# Patient Record
Sex: Male | Born: 1980
Health system: Southern US, Community
[De-identification: ages and names within clinical notes are randomized; demographics above are authoritative.]

## PROBLEM LIST (undated history)

## (undated) DIAGNOSIS — R51 Headache: Secondary | ICD-10-CM

## (undated) DIAGNOSIS — B019 Varicella without complication: Secondary | ICD-10-CM

## (undated) DIAGNOSIS — F419 Anxiety disorder, unspecified: Secondary | ICD-10-CM

## (undated) DIAGNOSIS — E119 Type 2 diabetes mellitus without complications: Secondary | ICD-10-CM

## (undated) DIAGNOSIS — I10 Essential (primary) hypertension: Secondary | ICD-10-CM

## (undated) DIAGNOSIS — F32A Depression, unspecified: Secondary | ICD-10-CM

## (undated) DIAGNOSIS — G43909 Migraine, unspecified, not intractable, without status migrainosus: Secondary | ICD-10-CM

## (undated) DIAGNOSIS — R519 Headache, unspecified: Secondary | ICD-10-CM

## (undated) DIAGNOSIS — F329 Major depressive disorder, single episode, unspecified: Secondary | ICD-10-CM

## (undated) HISTORY — DX: Headache, unspecified: R51.9

## (undated) HISTORY — DX: Type 2 diabetes mellitus without complications: E11.9

## (undated) HISTORY — DX: Anxiety disorder, unspecified: F41.9

## (undated) HISTORY — DX: Migraine, unspecified, not intractable, without status migrainosus: G43.909

## (undated) HISTORY — DX: Depression, unspecified: F32.A

## (undated) HISTORY — DX: Headache: R51

## (undated) HISTORY — DX: Essential (primary) hypertension: I10

## (undated) HISTORY — DX: Varicella without complication: B01.9

## (undated) HISTORY — DX: Major depressive disorder, single episode, unspecified: F32.9

---

## 2002-05-13 ENCOUNTER — Inpatient Hospital Stay (HOSPITAL_COMMUNITY): Admission: AC | Admit: 2002-05-13 | Discharge: 2002-05-14 | Payer: Self-pay

## 2012-03-08 ENCOUNTER — Emergency Department: Payer: Self-pay | Admitting: Emergency Medicine

## 2012-03-08 LAB — URINALYSIS, COMPLETE
Bacteria: NONE SEEN
Bilirubin,UR: NEGATIVE
Blood: NEGATIVE
Glucose,UR: >=500 mg/dL (ref 0–75)
Protein: NEGATIVE
RBC,UR: NONE SEEN /HPF (ref 0–5)
Specific Gravity: 1.031 (ref 1.003–1.030)
Squamous Epithelial: NONE SEEN
WBC UR: <1 /HPF (ref 0–5)

## 2012-03-08 LAB — COMPREHENSIVE METABOLIC PANEL
Albumin: 4.3 g/dL (ref 3.4–5.0)
Alkaline Phosphatase: 142 U/L — ABNORMAL HIGH (ref 50–136)
BUN: 12 mg/dL (ref 7–18)
Bilirubin,Total: 0.4 mg/dL (ref 0.2–1.0)
Chloride: 98 mmol/L (ref 98–107)
Co2: 21 mmol/L (ref 21–32)
Creatinine: 0.92 mg/dL (ref 0.60–1.30)
EGFR (African American): >60
Glucose: 514 mg/dL (ref 65–99)
SGOT(AST): 21 U/L (ref 15–37)
SGPT (ALT): 20 U/L (ref 12–78)
Total Protein: 7.1 g/dL (ref 6.4–8.2)

## 2012-03-08 LAB — CBC
MCV: 88 fL (ref 80–100)
Platelet: 244 10*3/uL (ref 150–440)
RDW: 12.6 % (ref 11.5–14.5)
WBC: 6.6 10*3/uL (ref 3.8–10.6)

## 2016-04-21 ENCOUNTER — Ambulatory Visit: Payer: Self-pay | Admitting: *Deleted

## 2016-05-30 ENCOUNTER — Ambulatory Visit: Payer: Self-pay | Admitting: Primary Care

## 2016-06-06 ENCOUNTER — Ambulatory Visit: Payer: Self-pay | Admitting: Primary Care

## 2016-06-14 ENCOUNTER — Ambulatory Visit (INDEPENDENT_AMBULATORY_CARE_PROVIDER_SITE_OTHER): Payer: No Typology Code available for payment source | Admitting: Primary Care

## 2016-06-14 ENCOUNTER — Other Ambulatory Visit: Payer: Self-pay | Admitting: Primary Care

## 2016-06-14 ENCOUNTER — Encounter: Payer: Self-pay | Admitting: Primary Care

## 2016-06-14 VITALS — BP 142/92 | HR 102 | Temp 98.0°F | Ht 68.5 in | Wt 146.4 lb

## 2016-06-14 DIAGNOSIS — IMO0002 Reserved for concepts with insufficient information to code with codable children: Secondary | ICD-10-CM | POA: Insufficient documentation

## 2016-06-14 DIAGNOSIS — F418 Other specified anxiety disorders: Secondary | ICD-10-CM

## 2016-06-14 DIAGNOSIS — F419 Anxiety disorder, unspecified: Principal | ICD-10-CM

## 2016-06-14 DIAGNOSIS — E109 Type 1 diabetes mellitus without complications: Secondary | ICD-10-CM | POA: Diagnosis not present

## 2016-06-14 DIAGNOSIS — E1065 Type 1 diabetes mellitus with hyperglycemia: Secondary | ICD-10-CM | POA: Insufficient documentation

## 2016-06-14 DIAGNOSIS — E785 Hyperlipidemia, unspecified: Secondary | ICD-10-CM

## 2016-06-14 DIAGNOSIS — I10 Essential (primary) hypertension: Secondary | ICD-10-CM

## 2016-06-14 DIAGNOSIS — F329 Major depressive disorder, single episode, unspecified: Secondary | ICD-10-CM

## 2016-06-14 DIAGNOSIS — F32A Depression, unspecified: Secondary | ICD-10-CM

## 2016-06-14 LAB — COMPREHENSIVE METABOLIC PANEL
ALT: 15 U/L (ref 0–53)
AST: 14 U/L (ref 0–37)
Albumin: 4.5 g/dL (ref 3.5–5.2)
Alkaline Phosphatase: 61 U/L (ref 39–117)
BUN: 6 mg/dL (ref 6–23)
CO2: 34 mEq/L — ABNORMAL HIGH (ref 19–32)
Calcium: 10 mg/dL (ref 8.4–10.5)
Chloride: 96 mEq/L (ref 96–112)
Creatinine, Ser: 0.95 mg/dL (ref 0.40–1.50)
GFR: 96.19 mL/min (ref >60.00)
Glucose, Bld: 211 mg/dL — ABNORMAL HIGH (ref 70–99)
Potassium: 4.2 mEq/L (ref 3.5–5.1)
Sodium: 135 mEq/L (ref 135–145)
Total Bilirubin: 0.5 mg/dL (ref 0.2–1.2)
Total Protein: 7 g/dL (ref 6.0–8.3)

## 2016-06-14 LAB — LIPID PANEL
CHOLESTEROL: 266 mg/dL — AB (ref 0–200)
HDL: 56.3 mg/dL (ref >39.00)
LDL Cholesterol: 192 mg/dL — ABNORMAL HIGH (ref 0–99)
NonHDL: 209.68
Total CHOL/HDL Ratio: 5
Triglycerides: 89 mg/dL (ref 0.0–149.0)
VLDL: 17.8 mg/dL (ref 0.0–40.0)

## 2016-06-14 LAB — HEMOGLOBIN A1C: HEMOGLOBIN A1C: 9.5 % — AB (ref 4.6–6.5)

## 2016-06-14 LAB — MICROALBUMIN / CREATININE URINE RATIO
Creatinine,U: 36.5 mg/dL
MICROALB/CREAT RATIO: 1.9 mg/g (ref 0.0–30.0)

## 2016-06-14 MED ORDER — VENLAFAXINE HCL ER 75 MG PO CP24: 75.0000 mg | ORAL_CAPSULE | Freq: Every day | ORAL | 3 refills | Status: DC

## 2016-06-14 MED ORDER — INSULIN ASPART PROT & ASPART (70-30 MIX) 100 UNIT/ML PEN: 10.0000 [IU] | PEN_INJECTOR | Freq: Two times a day (BID) | SUBCUTANEOUS | 5 refills | Status: DC

## 2016-06-14 MED ORDER — LISINOPRIL 10 MG PO TABS: 10.0000 mg | ORAL_TABLET | Freq: Every day | ORAL | 0 refills | Status: DC

## 2016-06-14 NOTE — Patient Instructions (Signed)
Complete lab work prior to leaving today. I will notify you of your results once received. I will refill your Novolin 70/30 insulin once we receive your A1C result.  I sent refills of Effexor XR 75 mg to your pharmacy.   Start lisinopril 10 mg tablets for blood pressure and kidney protection.   Check your blood pressure daily, around the same time of day, for the next 3 weeks.  Ensure that you have rested for 30 minutes prior to checking your blood pressure. Record your readings and bring them to your next visit.  It was a pleasure to meet you today! Please don't hesitate to call me with any questions. Welcome to [facility]!

## 2016-06-14 NOTE — Assessment & Plan Note (Signed)
Prior history of elevated readings, not on treatment. Check lipids today.

## 2016-06-14 NOTE — Progress Notes (Signed)
Pre visit review using our clinic review tool, if applicable. No additional management support is needed unless otherwise documented below in the visit note. 

## 2016-06-14 NOTE — Assessment & Plan Note (Signed)
Prior history, above goal today. Will initiate Lisinopril 10 mg today for renal protection and treatment. Follow up in 3 weeks for BP recheck and BMP.

## 2016-06-14 NOTE — Assessment & Plan Note (Signed)
Well managed on Effexor XR 75 mg, continue same. Denies SI/HI. Refills provided.

## 2016-06-14 NOTE — Progress Notes (Signed)
Subjective:    Patient ID: James Stanley, male    DOB: 23-Sep-1980, 36 y.o.   MRN: [id number]  HPI  James Stanley is a 36 year old male who presents today to establish care and discuss the problems mentioned below. Will obtain old records.  1) Type 1 Diabetes: Diagnosed several years ago. Currently managed on Novolog 70/30 4 units BID and Lantus 12 units at bedtime. His last A1C was over 1 year ago and doesn't remember his last reading. He denies dizziness, chest pain. He does have intermittent numbness to his hands, not bothersome. He checks his sugars 10 times daily. Fasting sugars run in the 180's, evening sugars run in the low 200's. He does think he had an elevated lipid panel 1 year ago, never managed on treatment.  2) Essential Hypertension: BP in the office today of 142/92. He is not on antihypertensive treatment at the present. History of hypertension in the past and was managed on lisinopril/HCTZ, has not had this medication in about 1 year as he never had it refilled. He does not check his BP regularly.   3) Anxiety and Depression: Currently managed on Effexor XR 75 mg. Previously managed on Celexa 40 mg and Clonazepam. He feels well managed on Effexor XR 75 mg alone. He denies SI/HI. He is requesting refills.  Review of Systems  Constitutional: Negative for fatigue.  Eyes: Negative for visual disturbance.  Respiratory: Negative for shortness of breath.   Cardiovascular: Negative for chest pain.  Neurological: Negative for headaches.       Intermittent numbness to hands  Psychiatric/Behavioral: Negative for suicidal ideas. The patient is not nervous/anxious.        Past Medical History:  Diagnosis Date  . Anxiety   . Chickenpox   . Depression   . Diabetes mellitus without complication ([place])   . Frequent headaches   . Hypertension   . Migraines      Social History   Social History  . Marital status: Single    Spouse name: N/A  . Number of children: N/A  . Years of  education: N/A   Occupational History  . Not on file.   Social History Main Topics  . Smoking status: Never Smoker  . Smokeless tobacco: Never Used  . Alcohol use No  . Drug use: Unknown  . Sexual activity: Not on file   Other Topics Concern  . Not on file   Social History Narrative   Single.   Works at [employer].   Moved from [place].   Enjoys reading.    No past surgical history on file.  Family History  Problem Relation Age of Onset  . Hyperlipidemia Mother   . Mental illness Mother   . Hypertension Father   . Diabetes Father   . Mental illness Father   . Hyperlipidemia Maternal Grandmother   . Heart disease Paternal Grandfather   . Hypertension Paternal Grandfather   . Testicular cancer Paternal Grandfather     No Known Allergies  No current outpatient prescriptions on file prior to visit.   No current facility-administered medications on file prior to visit.     BP (!) 142/92   Pulse (!) 102   Temp 98 F (36.7 C) (Oral)   Ht 5' 8.5" (1.74 m)   Wt 146 lb 6.4 oz (66.4 kg)   SpO2 98%   BMI 21.94 kg/m    Objective:   Physical Exam  Constitutional: He is oriented to person, place, and time.  He appears well-nourished.  Neck: Neck supple.  Cardiovascular: Normal rate and regular rhythm.   Pulmonary/Chest: Effort normal and breath sounds normal. He has no wheezes. He has no rales.  Neurological: He is alert and oriented to person, place, and time.  Skin: Skin is warm and dry.  Psychiatric: He has a normal mood and affect.          Assessment & Plan:

## 2016-06-14 NOTE — Assessment & Plan Note (Signed)
Diagnosed several years ago. Sugars seem high. Check A1C today. Will adjust insulin as needed. He will think about pneumonia vaccination. Foot exam next visit. Lipids pending.

## 2016-06-15 ENCOUNTER — Other Ambulatory Visit: Payer: Self-pay | Admitting: Primary Care

## 2016-06-15 DIAGNOSIS — E785 Hyperlipidemia, unspecified: Secondary | ICD-10-CM

## 2016-06-15 MED ORDER — ATORVASTATIN CALCIUM 40 MG PO TABS: 40.0000 mg | ORAL_TABLET | Freq: Every evening | ORAL | 3 refills | Status: DC

## 2016-06-20 ENCOUNTER — Other Ambulatory Visit: Payer: Self-pay

## 2016-06-20 DIAGNOSIS — F329 Major depressive disorder, single episode, unspecified: Secondary | ICD-10-CM

## 2016-06-20 DIAGNOSIS — F32A Depression, unspecified: Secondary | ICD-10-CM

## 2016-06-20 DIAGNOSIS — E109 Type 1 diabetes mellitus without complications: Secondary | ICD-10-CM

## 2016-06-20 DIAGNOSIS — E785 Hyperlipidemia, unspecified: Secondary | ICD-10-CM

## 2016-06-20 DIAGNOSIS — F419 Anxiety disorder, unspecified: Secondary | ICD-10-CM

## 2016-06-20 NOTE — Telephone Encounter (Signed)
Pt left v/m; pts ins would not cover the novolog mix 70/30 insulin and pt request different med substituted. Pt still has insulin to take at this time. Per DPR left v/m for pt to contact ins co to see what approved med could be substitued and call back to [facility] with info.

## 2016-06-24 MED ORDER — INSULIN LISPRO PROT & LISPRO (75-25 MIX) 100 UNIT/ML KWIKPEN: 10.0000 [IU] | PEN_INJECTOR | Freq: Two times a day (BID) | SUBCUTANEOUS | 11 refills | Status: DC

## 2016-06-24 MED ORDER — VENLAFAXINE HCL ER 75 MG PO CP24: 75.0000 mg | ORAL_CAPSULE | Freq: Every day | ORAL | 3 refills | Status: DC

## 2016-06-24 MED ORDER — ATORVASTATIN CALCIUM 40 MG PO TABS: 40.0000 mg | ORAL_TABLET | Freq: Every evening | ORAL | 3 refills | Status: DC

## 2016-06-24 NOTE — Telephone Encounter (Signed)
Message left for patient to return my call.  

## 2016-06-24 NOTE — Telephone Encounter (Signed)
Pt called back and stated that his insurance will cover Humalog 75/25 quick pen--- please send Rx to mail order... I do not see mail order pharm in chart--- lmovm for pt to call back to confirm mail order pharmacy

## 2016-06-24 NOTE — Telephone Encounter (Signed)
Patient called back and said it's [facility]. Patient hasn't used [facility] before.  He didn't know if you would need the information on his insurance card.  If so, patient can be reached at  [phone].

## 2016-06-24 NOTE — Telephone Encounter (Signed)
Noted, prescriptions sent to [facility].

## 2016-06-24 NOTE — Telephone Encounter (Signed)
Patient called back. Patient also request to send the rest of his medication to [facility] including the Humalog 75/25.

## 2016-07-21 ENCOUNTER — Ambulatory Visit: Payer: Self-pay | Admitting: *Deleted

## 2016-07-21 VITALS — BP 138/82

## 2016-07-21 DIAGNOSIS — I10 Essential (primary) hypertension: Secondary | ICD-10-CM

## 2016-07-21 NOTE — Progress Notes (Signed)
Elevated with home cuff last night. Regular today. Asymptomatic. Plans to bring home cuff to clinic tomorrow to correlate with manual pressures to check accuracy of BP machine.

## 2016-08-11 ENCOUNTER — Ambulatory Visit: Payer: Self-pay | Admitting: Registered Nurse

## 2016-08-11 VITALS — BP 116/82 | HR 91 | Temp 96.9°F

## 2016-08-11 DIAGNOSIS — J019 Acute sinusitis, unspecified: Secondary | ICD-10-CM

## 2016-08-11 DIAGNOSIS — H6593 Unspecified nonsuppurative otitis media, bilateral: Secondary | ICD-10-CM

## 2016-08-11 MED ORDER — FLUTICASONE PROPIONATE 50 MCG/ACT NA SUSP: 1.0000 | Freq: Two times a day (BID) | NASAL | 0 refills | Status: DC

## 2016-08-11 MED ORDER — FEXOFENADINE HCL 180 MG PO TABS: 180.0000 mg | ORAL_TABLET | Freq: Every day | ORAL | Status: DC

## 2016-08-11 MED ORDER — SALINE SPRAY 0.65 % NA SOLN: 2.0000 | NASAL | 0 refills | Status: DC

## 2016-08-11 NOTE — Progress Notes (Signed)
Subjective:    Patient ID: James Stanley, male    DOB: 10/07/1980, 36 y.o.   MRN: [id number]  35y/o caucasian male reports Hx migraines. Normally would be 3-4 migraines a year, but since Feb this year, has been having almost weekly migraines. Sx include left sided head pain, eye pain/pressure, light sensitivity, aura with "lightening bolts" in peripheral vision, and nausea. Migraines usually last 12-24 hours. Typically treat with sleep, Excedrin Migraine.   PMHx seasonal allergies  Dog sleeps with him  Blood sugars running 200 Last HgbA1c 9 working on his diabetes control as had gap in insurance noticed some post nasal drip  Established new PCM Jan 2018 hasn't talked with them about migraines yet      Review of Systems  Constitutional: Negative for activity change, appetite change, chills, diaphoresis, fatigue, fever and unexpected weight change.  HENT: Positive for congestion and postnasal drip. Negative for dental problem, drooling, ear discharge, ear pain, facial swelling, hearing loss, mouth sores, nosebleeds, rhinorrhea, sinus pain, sinus pressure, sneezing, sore throat, tinnitus, trouble swallowing and voice change.   Eyes: Positive for photophobia and visual disturbance. Negative for pain, discharge, redness and itching.  Respiratory: Negative for cough, choking, chest tightness, shortness of breath, wheezing and stridor.   Cardiovascular: Negative for chest pain, palpitations and leg swelling.  Gastrointestinal: Positive for nausea. Negative for abdominal distention, abdominal pain, blood in stool, constipation, diarrhea and vomiting.  Endocrine: Negative for cold intolerance and heat intolerance.  Genitourinary: Negative for dysuria.  Musculoskeletal: Negative for arthralgias, back pain, gait problem, joint swelling, myalgias, neck pain and neck stiffness.  Skin: Negative for color change, pallor, rash and wound.  Allergic/Immunologic: Positive for environmental allergies. Negative  for food allergies and immunocompromised state.  Neurological: Positive for headaches. Negative for dizziness, tremors, seizures, syncope, facial asymmetry, speech difficulty, weakness, light-headedness and numbness.  Hematological: Negative for adenopathy. Does not bruise/bleed easily.  Psychiatric/Behavioral: Negative for agitation, behavioral problems, confusion and sleep disturbance.       Objective:   Physical Exam  Constitutional: He is oriented to person, place, and time. Vital signs are normal. He appears well-developed and well-nourished. He is active and cooperative.  Non-toxic appearance. He does not have a sickly appearance. He does not appear ill. No distress.  HENT:  Head: Normocephalic and atraumatic.  Right Ear: Hearing, external ear and ear canal normal. A middle ear effusion is present.  Left Ear: Hearing, external ear and ear canal normal. A middle ear effusion is present.  Nose: Mucosal edema and rhinorrhea present. No nose lacerations, sinus tenderness, nasal deformity, septal deviation or nasal septal hematoma. No epistaxis.  No foreign bodies. Right sinus exhibits no maxillary sinus tenderness and no frontal sinus tenderness. Left sinus exhibits no maxillary sinus tenderness and no frontal sinus tenderness.  Mouth/Throat: Uvula is midline and mucous membranes are normal. Mucous membranes are not pale, not dry and not cyanotic. He does not have dentures. No oral lesions. No trismus in the jaw. Normal dentition. No dental abscesses, uvula swelling, lacerations or dental caries. Posterior oropharyngeal edema and posterior oropharyngeal erythema present. No oropharyngeal exudate or tonsillar abscesses.  Bilateral allergic shiners and lower eyelids swollen 1-2+/4 no erythema; bilateral nasal turbinates edema erythema clear discharge scant; cobblestoning posterior pharynx  Eyes: Conjunctivae, EOM and lids are normal. Pupils are equal, round, and reactive to light. Right eye exhibits  no chemosis, no discharge, no exudate and no hordeolum. No foreign body present in the right eye. Left eye exhibits no  chemosis, no discharge, no exudate and no hordeolum. No foreign body present in the left eye. Right conjunctiva is not injected. Right conjunctiva has no hemorrhage. Left conjunctiva is not injected. Left conjunctiva has no hemorrhage. No scleral icterus. Right eye exhibits normal extraocular motion and no nystagmus. Left eye exhibits normal extraocular motion and no nystagmus. Right pupil is round and reactive. Left pupil is round and reactive. Pupils are equal.  Neck: Trachea normal, normal range of motion and phonation normal. Neck supple. No tracheal tenderness, no spinous process tenderness and no muscular tenderness present. No neck rigidity. No tracheal deviation, no edema, no erythema and normal range of motion present. No thyroid mass and no thyromegaly present.  Cardiovascular: Normal rate, regular rhythm, S1 normal, S2 normal, normal heart sounds and intact distal pulses.  PMI is not displaced.  Exam reveals no gallop and no friction rub.   No murmur heard. Pulmonary/Chest: Effort normal and breath sounds normal. No stridor. No respiratory distress. He has no decreased breath sounds. He has no wheezes. He has no rhonchi. He has no rales.  No cough observed exam room; speaks full sentences without difficulty  Abdominal: Soft. He exhibits no distension.  Musculoskeletal: Normal range of motion. He exhibits no edema or tenderness.       Right shoulder: Normal.       Left shoulder: Normal.       Right elbow: Normal.      Right wrist: Normal.       Left wrist: Normal.       Right hip: Normal.       Left hip: Normal.       Right knee: Normal.       Left knee: Normal.       Cervical back: Normal.       Right hand: Normal.       Left hand: Normal.  Bilateral hand grasp equal; on/off exam table; in/out chair without difficulty gait sure and steady in hall  Lymphadenopathy:        Head (right side): No submental, no submandibular, no tonsillar, no preauricular, no posterior auricular and no occipital adenopathy present.       Head (left side): No submental, no submandibular, no tonsillar, no preauricular, no posterior auricular and no occipital adenopathy present.    He has no cervical adenopathy.       Right cervical: No superficial cervical, no deep cervical and no posterior cervical adenopathy present.      Left cervical: No superficial cervical, no deep cervical and no posterior cervical adenopathy present.  Neurological: He is alert and oriented to person, place, and time. He has normal strength. He displays no atrophy and no tremor. No cranial nerve deficit or sensory deficit. He exhibits normal muscle tone. He displays no seizure activity. Coordination and gait normal. GCS eye subscore is 4. GCS verbal subscore is 5. GCS motor subscore is 6.  Skin: Skin is warm, dry and intact. No abrasion, no bruising, no burn, no ecchymosis, no laceration, no lesion, no petechiae and no rash noted. He is not diaphoretic. No cyanosis or erythema. No pallor. Nails show no clubbing.  Psychiatric: He has a normal mood and affect. His speech is normal and behavior is normal. Judgment and thought content normal. Cognition and memory are normal.  Nursing note and vitals reviewed.         Assessment & Plan:  A-acute rhinosinusitis and bilateral otitis media  P-discussed acute allergy flare/weather changes can trigger migraine.  Serotonin syndrome possible with sumatriptan and effexor use.  Patient will trial flonase 2mcg 1 spray each nostril BID #1 RF0 dispensed from [facility] /nasal saline 2 sprays each nostril q2h wa/shower bid 1 bottle dispensed from clinic stock.  Patient denied personal/family history of ENT cancer.  Daily po OTC antihistamine and if no improvement will add singulair 10mg  po qhs.  Given handout on migraines from [facility].  Patient may use normal saline nasal spray as  needed.  Consider antihistamine or nasal steroid use.  Avoid triggers if possible.  Shower prior to bedtime if exposed to triggers.  If allergic dust/dust mites recommend mattress/pillow covers/encasements; washing linens, vacuuming, sweeping, dusting weekly.  Call or return to clinic as needed if these symptoms worsen or fail to improve as anticipated.   Exitcare handout on allergic rhinitis given to patient.  Patient verbalized understanding of instructions, agreed with plan of care and had no further questions at this time.   If worsening despite flonase/saline/antihistamine will consider antibiotic next week.  No evidence of systemic bacterial infection, non toxic and well hydrated.  I do not see where any further testing or imaging is necessary at this time.   I will suggest supportive care, rest, good hygiene and encourage the patient to take adequate fluids.  The patient is to return to clinic or EMERGENCY ROOM if symptoms worsen or change significantly.  Exitcare handout on sinusitis given to patient.  Patient verbalized agreement and understanding of treatment plan and had no further questions at this time.   P2:  Hand washing and cover cough  Supportive treatment.   No evidence of invasive bacterial infection, non toxic and well hydrated.  This is most likely self limiting viral infection.  I do not see where any further testing or imaging is necessary at this time.   I will suggest supportive care, rest, good hygiene and encourage the patient to take adequate fluids.  The patient is to return to clinic or EMERGENCY ROOM if symptoms worsen or change significantly e.g. ear pain, fever, purulent discharge from ears or bleeding.  Exitcare handout on otitis media with effusion given to patient.  Patient verbalized agreement and understanding of treatment plan.   P2:  Avoidance and hand washing.

## 2016-08-11 NOTE — Patient Instructions (Signed)
Otitis Media With Effusion, Pediatric Otitis media with effusion (OME) occurs when there is inflammation of the middle ear and fluid in the middle ear space. There are no signs and symptoms of infection. The middle ear space contains air and the bones for hearing. Air in the middle ear space helps to transmit sound to the brain. OME is a common condition in children, and it often occurs after an ear infection. This condition may be present for several weeks or longer after an ear infection. Most cases of this condition get better on their own. What are the causes? OME is caused by a blockage of the eustachian tube in one or both ears. These tubes drain fluid in the ears to the back of the nose (nasopharynx). If the tissue in the tube swells up (edema), the tube closes. This prevents fluid from draining. Blockage can be caused by:  Ear infections.  Colds and other upper respiratory infections.  Allergies.  Irritants, such as tobacco smoke.  Enlarged adenoids. The adenoids are areas of soft tissue located high in the back of the throat, behind the nose and the roof of the mouth. They are part of the body's natural defense (immune) system.  A mass in the nasopharynx.  Damage to the ear caused by pressure changes (barotrauma).  What increases the risk? Your child is more likely to develop this condition if:  He or she has repeated ear and sinus infections.  He or she has allergies.  He or she is exposed to tobacco smoke.  He or she attends daycare.  He or she is not breastfed.  What are the signs or symptoms? Symptoms of this condition may not be obvious. Sometimes this condition does not have any symptoms, or symptoms may overlap with those of a cold or upper respiratory tract illness. Symptoms of this condition include:  Temporary hearing loss.  A feeling of fullness in the ear without pain.  Irritability or agitation.  Balance (vestibular) problems.  As a result of hearing  loss, your child may:  Listen to the TV at a loud volume.  Not respond to questions.  Ask "What?" often when spoken to.  Mistake or confuse one sound or word for another.  Perform poorly at school.  Have a poor attention span.  Become agitated or irritated easily.  How is this diagnosed? This condition is diagnosed with an ear exam. Your child's health care provider will look inside your child's ear with an instrument (otoscope) to check for redness, swelling, and fluid. Other tests may be done, including:  A test to check the movement of the eardrum (pneumatic otoscopy). This is done by squeezing a small amount of air into the ear.  A test that changes air pressure in the middle ear to check how well the eardrum moves and to see if the eustachian tube is working (tympanogram).  Hearing test (audiogram). This test involves playing tones at different pitches to see if your child can hear each tone.  How is this treated? Treatment for this condition depends on the cause. In many cases, the fluid goes away on its own. In some cases, your child may need a procedure to create a hole in the eardrum to allow fluid to drain (myringotomy) and to insert small drainage tubes (tympanostomy tubes) into the eardrums. These tubes help to drain fluid and prevent infection. This procedure may be recommended if:  OME does not get better over several months.  Your child has many ear   Your child has noticeable hearing loss.  Your child has problems with speech and language development. Surgery may also be done to remove the adenoids (adenoidectomy). Follow these instructions at home:  Give over-the-counter and prescription medicines only as told by your child's health care provider.  Keep children away from any tobacco smoke.  Keep all follow-up visits as told by your child's health care provider. This is important. How is this prevented?  Keep your child's  vaccinations up to date. Make sure your child gets all recommended vaccinations, including a pneumonia and flu vaccine.  Encourage hand washing. Your child should wash his or her hands often with soap and water. If there is no soap and water, he or she should use hand sanitizer.  Avoid exposing your child to tobacco smoke.  Breastfeed your baby, if possible. Babies who are breastfed as long as possible are less likely to develop this condition. Contact a health care provider if:  Your child's hearing does not get better after 3 months.  Your child's hearing is worse.  Your child has ear pain.  Your child has a fever.  Your child has drainage from the ear.  Your child is dizzy.  Your child has a lump on his or her neck. Get help right away if:  Your child has bleeding from the nose.  Your child cannot move part of her or his face.  Your child has trouble breathing.  Your child cannot smell.  Your child develops severe congestion.  Your child develops weakness.  Your child who is younger than 3 months has a temperature of 100F (38C) or higher. Summary  Otitis media with effusion (OME) occurs when there is inflammation of the middle ear and fluid in the middle ear space.  This condition is caused by blockage of one or both eustachian tubes, which drain fluid in the ears to the back of the nose.  Symptoms of this condition can include temporary hearing loss, a feeling of fullness in the ear, irritability or agitation, and balance (vestibular) problems. Sometimes, there are no symptoms.  This condition is diagnosed with an ear exam and tests, such as pneumatic otoscopy, tympanogram, and audiogram.  Treatment for this condition depends on the cause. In many cases, the fluid goes away on its own. This information is not intended to replace advice given to you by your health care provider. Make sure you discuss any questions you have with your health care provider. Document  Released: 06/18/2003 Document Revised: 02/18/2016 Document Reviewed: 02/18/2016 Elsevier Interactive Patient Education  2017 [employer] Sinus Headache A sinus headache occurs when the paranasal sinuses become clogged or swollen. Paranasal sinuses are air pockets within the bones of the face. Sinus headaches can range from mild to severe. What are the causes? A sinus headache can result from various conditions that affect the sinuses, such as:  Colds.  Sinus infections.  Allergies. What are the signs or symptoms? The main symptom of this condition is a headache that may feel like pain or pressure in the face, forehead, ears, or upper teeth. People who have a sinus headache often have other symptoms, such as:  Congested or runny nose.  Fever.  Inability to smell. Weather changes can make symptoms worse. How is this diagnosed? This condition may be diagnosed based on:  A physical exam and medical history.  Imaging tests, such as a CT scan and MRI, to check for problems with the sinuses.  A specialist may look into the sinuses  with a tool that has a camera (endoscopy). How is this treated? Treatment for this condition depends on the cause.  Sinus pain that is caused by a sinus infection may be treated with antibiotic medicine.  Sinus pain that is caused by allergies may be helped by allergy medicines (antihistamines) and medicated nasal sprays.  Sinus pain that is caused by congestion may be helped by flushing the nose and sinuses with saline solution. Follow these instructions at home:  Take medicines only as directed by your health care provider.  If you were prescribed an antibiotic medicine, finish all of it even if you start to feel better.  If you have congestion, use a nasal spray to help reduce pressure.  If directed, apply a warm, moist washcloth to your face to help relieve pain. Contact a health care provider if:  You have headaches more than one time each  week.  You have sensitivity to light or sound.  You have a fever.  You feel sick to your stomach (nauseous) or you throw up (vomit).  Your headaches do not get better with treatment. Many people think that they have a sinus headache when they actually have migraines or tension headaches. Get help right away if:  You have vision problems.  You have sudden, severe pain in your face or head.  You have a seizure.  You are confused.  You have a stiff neck. This information is not intended to replace advice given to you by your health care provider. Make sure you discuss any questions you have with your health care provider. Document Released: 05/05/2004 Document Revised: 11/22/2015 Document Reviewed: 03/24/2014 Elsevier Interactive Patient Education  2017 [employer] Allergic Rhinitis Allergic rhinitis is when the mucous membranes in the nose respond to allergens. Allergens are particles in the air that cause your body to have an allergic reaction. This causes you to release allergic antibodies. Through a chain of events, these eventually cause you to release histamine into the blood stream. Although meant to protect the body, it is this release of histamine that causes your discomfort, such as frequent sneezing, congestion, and an itchy, runny nose. What are the causes? Seasonal allergic rhinitis (hay fever) is caused by pollen allergens that may come from grasses, trees, and weeds. Year-round allergic rhinitis (perennial allergic rhinitis) is caused by allergens such as house dust mites, pet dander, and mold spores. What are the signs or symptoms?  Nasal stuffiness (congestion).  Itchy, runny nose with sneezing and tearing of the eyes. How is this diagnosed? Your health care provider can help you determine the allergen or allergens that trigger your symptoms. If you and your health care provider are unable to determine the allergen, skin or blood testing may be used. Your health care  provider will diagnose your condition after taking your health history and performing a physical exam. Your health care provider may assess you for other related conditions, such as asthma, pink eye, or an ear infection. How is this treated? Allergic rhinitis does not have a cure, but it can be controlled by:  Medicines that block allergy symptoms. These may include allergy shots, nasal sprays, and oral antihistamines.  Avoiding the allergen. Hay fever may often be treated with antihistamines in pill or nasal spray forms. Antihistamines block the effects of histamine. There are over-the-counter medicines that may help with nasal congestion and swelling around the eyes. Check with your health care provider before taking or giving this medicine. If avoiding the allergen or the  medicine prescribed do not work, there are many new medicines your health care provider can prescribe. Stronger medicine may be used if initial measures are ineffective. Desensitizing injections can be used if medicine and avoidance does not work. Desensitization is when a patient is given ongoing shots until the body becomes less sensitive to the allergen. Make sure you follow up with your health care provider if problems continue. Follow these instructions at home: It is not possible to completely avoid allergens, but you can reduce your symptoms by taking steps to limit your exposure to them. It helps to know exactly what you are allergic to so that you can avoid your specific triggers. Contact a health care provider if:  You have a fever.  You develop a cough that does not stop easily (persistent).  You have shortness of breath.  You start wheezing.  Symptoms interfere with normal daily activities. This information is not intended to replace advice given to you by your health care provider. Make sure you discuss any questions you have with your health care provider. Document Released: 12/21/2000 Document Revised:  11/27/2015 Document Reviewed: 12/03/2012 Elsevier Interactive Patient Education  2017 [employer].

## 2016-09-12 ENCOUNTER — Other Ambulatory Visit: Payer: Self-pay | Admitting: Primary Care

## 2016-09-12 DIAGNOSIS — I10 Essential (primary) hypertension: Secondary | ICD-10-CM

## 2016-09-12 NOTE — Telephone Encounter (Signed)
Needs office visit for follow up on BP. Have him come fasting for labs.

## 2016-09-12 NOTE — Telephone Encounter (Signed)
Ok to refill? Electronically refill request for lisinopril (PRINIVIL,ZESTRIL) 10 MG tablet.   Last prescribed and seen on 06/14/2016.

## 2016-09-13 ENCOUNTER — Ambulatory Visit: Payer: Self-pay | Admitting: Registered Nurse

## 2016-09-13 VITALS — BP 121/85 | HR 96 | Temp 96.4°F

## 2016-09-13 DIAGNOSIS — H6593 Unspecified nonsuppurative otitis media, bilateral: Secondary | ICD-10-CM

## 2016-09-13 DIAGNOSIS — J301 Allergic rhinitis due to pollen: Secondary | ICD-10-CM

## 2016-09-13 DIAGNOSIS — F419 Anxiety disorder, unspecified: Secondary | ICD-10-CM

## 2016-09-13 DIAGNOSIS — F32A Depression, unspecified: Secondary | ICD-10-CM

## 2016-09-13 DIAGNOSIS — F329 Major depressive disorder, single episode, unspecified: Secondary | ICD-10-CM

## 2016-09-13 DIAGNOSIS — M940 Chondrocostal junction syndrome [Tietze]: Secondary | ICD-10-CM

## 2016-09-13 NOTE — Progress Notes (Signed)
Subjective:    Patient ID: James Stanley, male    DOB: 1980-11-05, 36 y.o.   MRN: [id number]  35y/o single caucasian male lives with parents has type I diabetes blood sugars running 200+ PCM recently switched his insulin but he hasn't filled Rx yet using up supply he had at home first.  Had acute rhinitis beginning of May seasonal allergies.  Pt reports reoccurrence costochondritis. Hx of same. Pain to central sternum. Worse with movement, breathing, palpation. Reports swelling/irritation to site. Has not tried any intervention. Denies any type of exertion that might provoke a flare.  Had stopped taking his allergy medications  Denied fever/chills/nausea/vomiting/chest tightness/headache.      Review of Systems  Constitutional: Negative for activity change, appetite change, chills, diaphoresis, fatigue, fever and unexpected weight change.  HENT: Positive for congestion, postnasal drip and rhinorrhea. Negative for dental problem, drooling, ear discharge, ear pain, facial swelling, hearing loss, mouth sores, nosebleeds, sinus pain, sinus pressure, sneezing, sore throat, tinnitus, trouble swallowing and voice change.   Eyes: Negative for photophobia, pain, discharge, redness, itching and visual disturbance.  Respiratory: Positive for cough. Negative for choking, chest tightness, shortness of breath, wheezing and stridor.   Cardiovascular: Negative for chest pain, palpitations and leg swelling.  Gastrointestinal: Negative for abdominal distention, abdominal pain, blood in stool, constipation, diarrhea, nausea and vomiting.  Endocrine: Negative for cold intolerance and heat intolerance.  Genitourinary: Negative for dysuria.  Musculoskeletal: Negative for arthralgias, back pain, gait problem, joint swelling, myalgias, neck pain and neck stiffness.  Skin: Negative for color change, pallor, rash and wound.  Allergic/Immunologic: Positive for environmental allergies. Negative for food allergies and  immunocompromised state.  Neurological: Negative for dizziness, tremors, seizures, syncope, facial asymmetry, speech difficulty, weakness, light-headedness, numbness and headaches.  Hematological: Negative for adenopathy. Does not bruise/bleed easily.  Psychiatric/Behavioral: Negative for agitation, behavioral problems, confusion and sleep disturbance.       Objective:   Physical Exam  Constitutional: He is oriented to person, place, and time. Vital signs are normal. He appears well-developed and well-nourished. He is active and cooperative.  Non-toxic appearance. He does not have a sickly appearance. He does not appear ill. No distress.  HENT:  Head: Normocephalic and atraumatic.  Right Ear: Hearing, external ear and ear canal normal. A middle ear effusion is present.  Left Ear: Hearing, external ear and ear canal normal. A middle ear effusion is present.  Nose: Mucosal edema and rhinorrhea present. No nose lacerations, sinus tenderness, nasal deformity, septal deviation or nasal septal hematoma. No epistaxis.  No foreign bodies. Right sinus exhibits no maxillary sinus tenderness and no frontal sinus tenderness. Left sinus exhibits no maxillary sinus tenderness and no frontal sinus tenderness.  Mouth/Throat: Uvula is midline and mucous membranes are normal. Mucous membranes are not pale, not dry and not cyanotic. He does not have dentures. No oral lesions. No trismus in the jaw. Normal dentition. No dental abscesses, uvula swelling, lacerations or dental caries. Posterior oropharyngeal edema and posterior oropharyngeal erythema present. No oropharyngeal exudate or tonsillar abscesses.  Bilateral allergic shiners; cobblestoning posterior pharynx; bilateral TMs air fluid level clear; bilateral nasal turbinates edema erythema clear discharge  Eyes: Conjunctivae, EOM and lids are normal. Pupils are equal, round, and reactive to light. Right eye exhibits no chemosis, no discharge, no exudate and no  hordeolum. No foreign body present in the right eye. Left eye exhibits no chemosis, no discharge, no exudate and no hordeolum. No foreign body present in the left eye. Right  conjunctiva is not injected. Right conjunctiva has no hemorrhage. Left conjunctiva is not injected. Left conjunctiva has no hemorrhage. No scleral icterus. Right eye exhibits normal extraocular motion and no nystagmus. Left eye exhibits normal extraocular motion and no nystagmus. Right pupil is round and reactive. Left pupil is round and reactive. Pupils are equal.  Neck: Trachea normal, normal range of motion and phonation normal. Neck supple. No tracheal tenderness, no spinous process tenderness and no muscular tenderness present. No neck rigidity. No tracheal deviation, no edema, no erythema and normal range of motion present. No thyroid mass and no thyromegaly present.  Cardiovascular: Normal rate, regular rhythm, S1 normal, S2 normal, normal heart sounds and intact distal pulses.  PMI is not displaced.  Exam reveals no gallop and no friction rub.   No murmur heard.   Pulmonary/Chest: Effort normal and breath sounds normal. No accessory muscle usage or stridor. No respiratory distress. He has no decreased breath sounds. He has no wheezes. He has no rhonchi. He has no rales.  No cough observed; speaks full sentences without difficulty  Abdominal: Soft. He exhibits no distension.  Musculoskeletal: Normal range of motion. He exhibits no edema or tenderness.       Right shoulder: Normal.       Left shoulder: Normal.       Right elbow: Normal.      Left elbow: Normal.       Right wrist: Normal.       Left wrist: Normal.       Right hip: Normal.       Left hip: Normal.       Right knee: Normal.       Left knee: Normal.       Cervical back: Normal.       Thoracic back: Normal.       Lumbar back: Normal.       Right hand: Normal.       Left hand: Normal.  On/off exam table; in/out of chair without difficulty; gait sure and  steady in hall/clinic  Lymphadenopathy:       Head (right side): No submental, no submandibular, no tonsillar, no preauricular, no posterior auricular and no occipital adenopathy present.       Head (left side): No submental, no submandibular, no tonsillar, no preauricular, no posterior auricular and no occipital adenopathy present.    He has no cervical adenopathy.       Right cervical: No superficial cervical, no deep cervical and no posterior cervical adenopathy present.      Left cervical: No superficial cervical, no deep cervical and no posterior cervical adenopathy present.  Neurological: He is alert and oriented to person, place, and time. He has normal strength. He is not disoriented. He displays no atrophy and no tremor. No cranial nerve deficit or sensory deficit. He exhibits normal muscle tone. He displays no seizure activity. Coordination and gait normal. GCS eye subscore is 4. GCS verbal subscore is 5. GCS motor subscore is 6.  Bilateral hand grasp equal 5/5  Skin: Skin is warm, dry and intact. Rash noted. No abrasion, no bruising, no burn, no ecchymosis, no laceration, no lesion, no petechiae and no purpura noted. Rash is macular. Rash is not papular, not maculopapular, not nodular, not pustular, not vesicular and not urticarial. He is not diaphoretic. No cyanosis or erythema. No pallor. Nails show no clubbing.     2x3cm right anterior chest adjacent sternum grouped slightly ttp dry  Psychiatric: He has a  normal mood and affect. His speech is normal and behavior is normal. Judgment and thought content normal. Cognition and memory are normal.  Completed PHQ-9 in [facility]  Nursing note and vitals reviewed.         Assessment & Plan:  A-Type I Diabetes uncontrolled, depression, costochrondritis, seasonal allergic rhinitis, otitis media with effusion bilaterally  Patient trying to use up current supply insulin has new Rx from [facility] will fill later this month.  Discussed healthy snacks as  emotional eater e.g. Homemade kale chips, vegetables.  Patient to repeat fasting labs next quarter f/u with PCM.  Continue home monitoring of blood sugars and bring log to next PCM appt.  Encouraged to continue exercise routine and wear supportive shoes.  Moisturize skin to keep healthy and prevent sores/breakdown.  Annual routine diabetic eye exam when due.  Patient verbalized understanding of information/instructions/ agreed with plan of care and had no further questions at this time  Continue medications as directed from [facility].  Discussed sleep hygiene, maintaining routine.  See outpatient record for PHQ 9 results. No S/I or H/I, plan, or ideation.  Pt is reliable.  Discussed other resources patient may use if symptoms worsen EMERGENCY ROOM, chaplain, PCM on call or [facility] [place].  Return to the clinic if any new or worsening symptoms. Patient given [facility] handout on depression. Patient verbalized understanding of information/instructions, agreed with plan of care and had no further questions at this time. P2:  Diet and Exercise.  Stress reduction.  Discussed NSAID use prn.  Treating allergies so cough reflex decreased.  Discussed tighter control of blood sugars decreases inflammation in the body.  Hydrate with water as typically drinks diet soda.  Alternate soda with water to keep urine pale yellow clear.  If redness or pain worsening, fever, chills, shortness of breath, wheezing follow up for re-evaluation  Patient verbalized understanding information/instructions, agreed with plan of care and had no further questions at this time.  Patient may use normal saline nasal spray as needed.  Consider antihistamine or nasal steroid use.  Avoid triggers if possible.  Shower prior to bedtime if exposed to triggers.  If allergic dust/dust mites recommend mattress/pillow covers/encasements; washing linens, vacuuming, sweeping, dusting weekly.  Call or return to clinic as needed if these symptoms worsen or fail  to improve as anticipated.   Exitcare handout on allergic rhinitis given to patient.  Patient verbalized understanding of instructions, agreed with plan of care and had no further questions at this time.  P2:  Avoidance and hand washing.  Supportive treatment.   No evidence of invasive bacterial infection, non toxic and well hydrated.  This is most likely self limiting viral infection.  I do not see where any further testing or imaging is necessary at this time.   I will suggest supportive care, rest, good hygiene and encourage the patient to take adequate fluids.  The patient is to return to clinic or EMERGENCY ROOM if symptoms worsen or change significantly e.g. ear pain, fever, purulent discharge from ears or bleeding.  Exitcare handout on otitis media with effusion given to patient.  Patient verbalized agreement and understanding of treatment plan.

## 2016-09-13 NOTE — Patient Instructions (Signed)
Costochondritis Costochondritis is swelling and irritation (inflammation) of the tissue (cartilage) that connects your ribs to your breastbone (sternum). This causes pain in the front of your chest. The pain usually starts gradually and involves more than one rib. What are the causes? The exact cause of this condition is not always known. It results from stress on the cartilage where your ribs attach to your sternum. The cause of this stress could be:  Chest injury (trauma).  Exercise or activity, such as lifting.  Severe coughing.  What increases the risk? You may be at higher risk for this condition if you:  Are male.  Are 30?35 years old.  Recently started a new exercise or work activity.  Have low levels of vitamin D.  Have a condition that makes you cough frequently.  What are the signs or symptoms? The main symptom of this condition is chest pain. The pain:  Usually starts gradually and can be sharp or dull.  Gets worse with deep breathing, coughing, or exercise.  Gets better with rest.  May be worse when you press on the sternum-rib connection (tenderness).  How is this diagnosed? This condition is diagnosed based on your symptoms, medical history, and a physical exam. Your health care provider will check for tenderness when pressing on your sternum. This is the most important finding. You may also have tests to rule out other causes of chest pain. These may include:  A chest X-ray to check for lung problems.  An electrocardiogram (ECG) to see if you have a heart problem that could be causing the pain.  An imaging scan to rule out a chest or rib fracture.  How is this treated? This condition usually goes away on its own over time. Your health care provider may prescribe an NSAID to reduce pain and inflammation. Your health care provider may also suggest that you:  Rest and avoid activities that make pain worse.  Apply heat or cold to the area to reduce pain  and inflammation.  Do exercises to stretch your chest muscles.  If these treatments do not help, your health care provider may inject a numbing medicine at the sternum-rib connection to help relieve the pain. Follow these instructions at home:  Avoid activities that make pain worse. This includes any activities that use chest, abdominal, and side muscles.  If directed, put ice on the painful area: ? Put ice in a plastic bag. ? Place a towel between your skin and the bag. ? Leave the ice on for 20 minutes, 2-3 times a day.  If directed, apply heat to the affected area as often as told by your health care provider. Use the heat source that your health care provider recommends, such as a moist heat pack or a heating pad. ? Place a towel between your skin and the heat source. ? Leave the heat on for 20-30 minutes. ? Remove the heat if your skin turns bright red. This is especially important if you are unable to feel pain, heat, or cold. You may have a greater risk of getting burned.  Take over-the-counter and prescription medicines only as told by your health care provider.  Return to your normal activities as told by your health care provider. Ask your health care provider what activities are safe for you.  Keep all follow-up visits as told by your health care provider. This is important. Contact a health care provider if:  You have chills or a fever.  Your pain does not go   away or it gets worse.  You have a cough that does not go away (is persistent). Get help right away if:  You have shortness of breath. This information is not intended to replace advice given to you by your health care provider. Make sure you discuss any questions you have with your health care provider. Document Released: 01/05/2005 Document Revised: 10/16/2015 Document Reviewed: 07/22/2015 Elsevier Interactive Patient Education  2018 [employer]. Allergic Rhinitis Allergic rhinitis is when the mucous  membranes in the nose respond to allergens. Allergens are particles in the air that cause your body to have an allergic reaction. This causes you to release allergic antibodies. Through a chain of events, these eventually cause you to release histamine into the blood stream. Although meant to protect the body, it is this release of histamine that causes your discomfort, such as frequent sneezing, congestion, and an itchy, runny nose. What are the causes? Seasonal allergic rhinitis (hay fever) is caused by pollen allergens that may come from grasses, trees, and weeds. Year-round allergic rhinitis (perennial allergic rhinitis) is caused by allergens such as house dust mites, pet dander, and mold spores. What are the signs or symptoms?  Nasal stuffiness (congestion).  Itchy, runny nose with sneezing and tearing of the eyes. How is this diagnosed? Your health care provider can help you determine the allergen or allergens that trigger your symptoms. If you and your health care provider are unable to determine the allergen, skin or blood testing may be used. Your health care provider will diagnose your condition after taking your health history and performing a physical exam. Your health care provider may assess you for other related conditions, such as asthma, pink eye, or an ear infection. How is this treated? Allergic rhinitis does not have a cure, but it can be controlled by:  Medicines that block allergy symptoms. These may include allergy shots, nasal sprays, and oral antihistamines.  Avoiding the allergen.  Hay fever may often be treated with antihistamines in pill or nasal spray forms. Antihistamines block the effects of histamine. There are over-the-counter medicines that may help with nasal congestion and swelling around the eyes. Check with your health care provider before taking or giving this medicine. If avoiding the allergen or the medicine prescribed do not work, there are many new  medicines your health care provider can prescribe. Stronger medicine may be used if initial measures are ineffective. Desensitizing injections can be used if medicine and avoidance does not work. Desensitization is when a patient is given ongoing shots until the body becomes less sensitive to the allergen. Make sure you follow up with your health care provider if problems continue. Follow these instructions at home: It is not possible to completely avoid allergens, but you can reduce your symptoms by taking steps to limit your exposure to them. It helps to know exactly what you are allergic to so that you can avoid your specific triggers. Contact a health care provider if:  You have a fever.  You develop a cough that does not stop easily (persistent).  You have shortness of breath.  You start wheezing.  Symptoms interfere with normal daily activities. This information is not intended to replace advice given to you by your health care provider. Make sure you discuss any questions you have with your health care provider. Document Released: 12/21/2000 Document Revised: 11/27/2015 Document Reviewed: 12/03/2012 Elsevier Interactive Patient Education  2017 [employer] Sinus Rinse What is a sinus rinse? A sinus rinse is a simple  home treatment that is used to rinse your sinuses with a sterile mixture of salt and water (saline solution). Sinuses are air-filled spaces in your skull behind the bones of your face and forehead that open into your nasal cavity. You will use the following:  Saline solution.  Neti pot or spray bottle. This releases the saline solution into your nose and through your sinuses. Neti pots and spray bottles can be purchased at [employer], a health food store, or online.  When would I do a sinus rinse? A sinus rinse can help to clear mucus, dirt, dust, or pollen from the nasal cavity. You may do a sinus rinse when you have a cold, a virus, nasal allergy symptoms, a  sinus infection, or stuffiness in the nose or sinuses. If you are considering a sinus rinse:  Ask your child's health care provider before performing a sinus rinse on your child.  Do not do a sinus rinse if you have had ear or nasal surgery, ear infection, or blocked ears.  How do I do a sinus rinse?  Wash your hands.  Disinfect your device according to the directions provided and then dry it.  Use the solution that comes with your device or one that is sold separately in stores. Follow the mixing directions on the package.  Fill your device with the amount of saline solution as directed by the device instructions.  Stand over a sink and tilt your head sideways over the sink.  Place the spout of the device in your upper nostril (the one closer to the ceiling).  Gently pour or squeeze the saline solution into the nasal cavity. The liquid should drain to the lower nostril if you are not overly congested.  Gently blow your nose. Blowing too hard may cause ear pain.  Repeat in the other nostril.  Clean and rinse your device with clean water and then air-dry it. Are there risks of a sinus rinse? Sinus rinse is generally very safe and effective. However, there are a few risks, which include:  A burning sensation in the sinuses. This may happen if you do not make the saline solution as directed. Make sure to follow all directions when making the saline solution.  Infection from contaminated water. This is rare, but possible.  Nasal irritation.  This information is not intended to replace advice given to you by your health care provider. Make sure you discuss any questions you have with your health care provider. Document Released: 10/23/2013 Document Revised: 02/23/2016 Document Reviewed: 08/13/2013 Elsevier Interactive Patient Education  2017 [employer].

## 2016-09-13 NOTE — Telephone Encounter (Signed)
Detailed message left on Vm per DPR to call the office to schedule fasting labs and OV with Anda Kraft.

## 2016-10-13 ENCOUNTER — Encounter: Payer: Self-pay | Admitting: Family Medicine

## 2016-10-13 ENCOUNTER — Ambulatory Visit (INDEPENDENT_AMBULATORY_CARE_PROVIDER_SITE_OTHER): Payer: No Typology Code available for payment source | Admitting: Family Medicine

## 2016-10-13 VITALS — BP 116/82 | HR 102 | Temp 97.5°F | Wt 147.5 lb

## 2016-10-13 DIAGNOSIS — E10630 Type 1 diabetes mellitus with periodontal disease: Secondary | ICD-10-CM | POA: Diagnosis not present

## 2016-10-13 DIAGNOSIS — E785 Hyperlipidemia, unspecified: Secondary | ICD-10-CM

## 2016-10-13 DIAGNOSIS — E1065 Type 1 diabetes mellitus with hyperglycemia: Secondary | ICD-10-CM

## 2016-10-13 DIAGNOSIS — IMO0002 Reserved for concepts with insufficient information to code with codable children: Secondary | ICD-10-CM

## 2016-10-13 DIAGNOSIS — K0889 Other specified disorders of teeth and supporting structures: Secondary | ICD-10-CM

## 2016-10-13 LAB — LDL CHOLESTEROL, DIRECT: LDL DIRECT: 84 mg/dL

## 2016-10-13 LAB — HEMOGLOBIN A1C: HEMOGLOBIN A1C: 10.1 % — AB (ref 4.6–6.5)

## 2016-10-13 MED ORDER — AMOXICILLIN 500 MG PO CAPS: 500.0000 mg | ORAL_CAPSULE | Freq: Three times a day (TID) | ORAL | 0 refills | Status: DC

## 2016-10-13 NOTE — Assessment & Plan Note (Signed)
Will refer to endo per pt request. Latest A1c uncontrolled. Update A1c.

## 2016-10-13 NOTE — Assessment & Plan Note (Signed)
Anticipate dental pain from impacted wisdom teeth on left.  As pt endorses h/o pus drainage around site, will cover with amox course while we refer to oral surgery for further evaluation. Pt agrees with plan.

## 2016-10-13 NOTE — Progress Notes (Signed)
BP 116/82 (BP Location: Left Arm, Patient Position: Sitting, Cuff Size: Normal)   Pulse (!) 102   Temp (!) 97.5 F (36.4 C) (Tympanic)   Wt 147 lb 8 oz (66.9 kg)   SpO2 99%   BMI 22.10 kg/m    CC: swollen glands in mouth Subjective:    Patient ID: James Stanley, male    DOB: 12-15-1980, 36 y.o.   MRN: [id number]  HPI: James Stanley is a 36 y.o. male presenting on 10/13/2016 for Adenopathy (in mouth ); Oral Swelling (? if wisdom teeth need to be removed); and Referral (wants referral to oral surgeon if needed)   Wisdom teeth in place. Ongoing L jaw pain, swollen glands, some pus present intermittently. Worse pain over last 4 days. H/o L upper molar chipped. Some jaw swelling present as well. L earache. No right sided jaw pain. Mild nausea.   No fevers/chills.   T1DM - has not established with endo yet. Requests endo referral today.  Lab Results  Component Value Date   HGBA1C 9.5 (H) 06/14/2016  He is on lantus 12 u nightly, humalog 4u with lunch and dinner + SSI.   Relevant past medical, surgical, family and social history reviewed and updated as indicated. Interim medical history since our last visit reviewed. Allergies and medications reviewed and updated. Outpatient Medications Prior to Visit  Medication Sig Dispense Refill  . aspirin-acetaminophen-caffeine (EXCEDRIN MIGRAINE) 250-250-65 MG tablet Take by mouth every 6 (six) hours as needed for headache.    Marland Kitchen atorvastatin (LIPITOR) 40 MG tablet Take 1 tablet (40 mg total) by mouth every evening. 90 tablet 3  . Insulin Glargine (LANTUS) 100 UNIT/ML Solostar Pen Inject 12 Units into the skin at bedtime.    . insulin lispro (HUMALOG) 100 UNIT/ML injection Inject into the skin 2 (two) times daily with a meal. Taking until out, then switching to Humalog 75/25 pen    . Insulin Lispro Prot & Lispro (HUMALOG MIX 75/25 KWIKPEN) (75-25) 100 UNIT/ML Kwikpen Inject 10 Units into the skin 2 (two) times daily with a meal. 15 mL 11  .  lisinopril (PRINIVIL,ZESTRIL) 10 MG tablet TAKE 1 TABLET (10 MG TOTAL) BY MOUTH DAILY. 30 tablet 0  . venlafaxine XR (EFFEXOR-XR) 75 MG 24 hr capsule Take 1 capsule (75 mg total) by mouth daily with breakfast. 90 capsule 3  . fluticasone (FLONASE) 50 MCG/ACT nasal spray Place 1 spray into both nostrils 2 (two) times daily. 16 g 0  . sodium chloride (OCEAN) 0.65 % SOLN nasal spray Place 2 sprays into both nostrils every 2 (two) hours while awake. (Patient not taking: Reported on 09/13/2016)  0   No facility-administered medications prior to visit.      Per HPI unless specifically indicated in ROS section below Review of Systems     Objective:    BP 116/82 (BP Location: Left Arm, Patient Position: Sitting, Cuff Size: Normal)   Pulse (!) 102   Temp (!) 97.5 F (36.4 C) (Tympanic)   Wt 147 lb 8 oz (66.9 kg)   SpO2 99%   BMI 22.10 kg/m   Wt Readings from Last 3 Encounters:  10/13/16 147 lb 8 oz (66.9 kg)  06/14/16 146 lb 6.4 oz (66.4 kg)    Physical Exam  Constitutional: He appears well-developed and well-nourished. No distress.  HENT:  Head: Normocephalic and atraumatic.  Mouth/Throat: Uvula is midline, oropharynx is clear and moist and mucous membranes are normal. No oropharyngeal exudate, posterior oropharyngeal edema, posterior oropharyngeal  erythema or tonsillar abscesses.  Skin breakdown along gumline upper molars Inner left cheek ulcer Tender to palpation hard palate behind wisdom teeth Left upper and lower wisdom teeth partly protruding through skin, tender to palpation No frank abscess appreciated  Eyes: Conjunctivae are normal. Pupils are equal, round, and reactive to light.  Neck: No thyromegaly present.  Lymphadenopathy:    He has no cervical adenopathy.  Nursing note and vitals reviewed.     Assessment & Plan:   Problem List Items Addressed This Visit    Diabetes mellitus type 1, uncontrolled, insulin dependent ([place])    Will refer to endo per pt request. Latest  A1c uncontrolled. Update A1c.       Relevant Orders   Ambulatory referral to Endocrinology   Hemoglobin A1c   Hyperlipidemia   Relevant Orders   LDL Cholesterol, Direct   Pain, dental - Primary    Anticipate dental pain from impacted wisdom teeth on left.  As pt endorses h/o pus drainage around site, will cover with amox course while we refer to oral surgery for further evaluation. Pt agrees with plan.       Relevant Orders   Ambulatory referral to Oral Maxillofacial Surgery       Follow up plan: Return if symptoms worsen or fail to improve.  Ria Bush, MD

## 2016-10-13 NOTE — Patient Instructions (Addendum)
We will refer you to oral surgeon for further evaluation. Start penicillin course for possible infection.  We will refer you to endo for diabetes follow up.  Good to meet you today. Call us with questions.

## 2016-10-26 ENCOUNTER — Other Ambulatory Visit: Payer: Self-pay | Admitting: Primary Care

## 2016-10-26 DIAGNOSIS — I10 Essential (primary) hypertension: Secondary | ICD-10-CM

## 2016-10-26 MED ORDER — AMOXICILLIN 500 MG PO CAPS: 500.0000 mg | ORAL_CAPSULE | Freq: Three times a day (TID) | ORAL | 0 refills | Status: DC

## 2016-10-26 NOTE — Telephone Encounter (Signed)
Will refill x1, but don't recommend he start taking as improving, unless pain again worsening with pus or concerns for infection. Do recommend oral surgery f/u.

## 2016-10-26 NOTE — Telephone Encounter (Signed)
Pt left v/m; pt was seen 06/14/16 to establish and pt was to return 3 wk f/u for BP. Pt did not return until 10/13/16 due to dental problem. Pt request refill lisinopril and amoxicillin to [facility]. Pt will call later to schedule appt and pt said has taken abx and is better but tooth still hurting slightly. Pt request cb.

## 2016-10-26 NOTE — Telephone Encounter (Signed)
Will send amoxil request to treating MD. Will refill lisinopril as his BP was improved on his visit on 10/13/16. Still needs BMP and office visit follow up for BP, will provide 30 day supply.

## 2016-10-27 NOTE — Telephone Encounter (Signed)
Lm on pts vm requesting a call back 

## 2016-10-28 ENCOUNTER — Telehealth: Payer: Self-pay | Admitting: Primary Care

## 2016-10-28 NOTE — Telephone Encounter (Signed)
Pt returned missed call 

## 2016-10-31 NOTE — Telephone Encounter (Signed)
Left detailed message on voicemail.  

## 2016-11-24 ENCOUNTER — Other Ambulatory Visit: Payer: Self-pay | Admitting: Primary Care

## 2016-11-24 DIAGNOSIS — I10 Essential (primary) hypertension: Secondary | ICD-10-CM

## 2016-12-02 ENCOUNTER — Other Ambulatory Visit: Payer: Self-pay

## 2016-12-02 MED ORDER — AMOXICILLIN 500 MG PO CAPS: 500.0000 mg | ORAL_CAPSULE | Freq: Three times a day (TID) | ORAL | 0 refills | Status: DC

## 2016-12-02 NOTE — Telephone Encounter (Signed)
Left message on vm for pt 

## 2016-12-02 NOTE — Telephone Encounter (Signed)
Pt left v/m pt seen 10/13/16 for infected wisdom tooth; abx took care of infection; pt saw oral surgeon and due to cost pt has had to save some money to get procedure done. Pt is supposed to have procedure next month but pt is diabetic and now tooth is starting to hurt again. Pt request refill abx until can get wisdom tooth removed. Pt request cb.

## 2016-12-02 NOTE — Telephone Encounter (Signed)
Refilled. plz notify pt.

## 2016-12-07 ENCOUNTER — Ambulatory Visit: Payer: Self-pay | Admitting: Endocrinology

## 2016-12-08 ENCOUNTER — Ambulatory Visit: Payer: Self-pay | Admitting: Endocrinology

## 2016-12-08 NOTE — Progress Notes (Deleted)
Patient ID: James Stanley, male   DOB: 04/19/1980, 36 y.o.   MRN: [id number]          Reason for Appointment : Consultation for Type 1 Diabetes  History of Present Illness          Diagnosis: Type 1 diabetes mellitus, date of diagnosis:          Previous history:    Recent history:     INSULIN regimen is:   Current management, blood sugar patterns and problems identified:            Glucose monitoring:  is being done 3 times a day         Glucometer: One Touch.      Blood Glucose readings from meter download:  Mean values apply above for all meters except median for One Touch  PRE-MEAL Fasting Lunch Dinner Bedtime Overall  Glucose range:       Mean/median:        POST-MEAL PC Breakfast PC Lunch PC Dinner  Glucose range:     Mean/median:       Hypoglycemia:  occurs at Factors causing hyperglycemia: Symptoms of hypoglycemia: Treatment of hypoglycemia:          Self-care: The diet that the patient has been following is:  Mealtimes are: Breakfast at  Lunch: Dinner:          Exercise:          Dietician consultation: Most recent:.         CDE consultation:  Diabetes labs:  Lab Results  Component Value Date   HGBA1C 10.1 (H) 10/13/2016   HGBA1C 9.5 (H) 06/14/2016   HGBA1C 11.3 (H) 03/08/2012   Lab Results  Component Value Date   MICROALBUR <0.7 06/14/2016   LDLCALC 192 (H) 06/14/2016   CREATININE 0.95 06/14/2016    Lab Results  Component Value Date   MICRALBCREAT 1.9 06/14/2016     Allergies as of 12/08/2016   No Known Allergies     Medication List       Accurate as of 12/08/16  8:09 AM. Always use your most recent med list.          amoxicillin 500 MG capsule Commonly known as:  AMOXIL Take 1 capsule (500 mg total) by mouth 3 (three) times daily.   aspirin-acetaminophen-caffeine 619-509-32 MG tablet Commonly known as:  EXCEDRIN MIGRAINE Take by mouth every 6 (six) hours as needed for headache.   atorvastatin 40 MG tablet Commonly  known as:  LIPITOR Take 1 tablet (40 mg total) by mouth every evening.   fluticasone 50 MCG/ACT nasal spray Commonly known as:  FLONASE Place 1 spray into both nostrils 2 (two) times daily.   Insulin Glargine 100 UNIT/ML Solostar Pen Commonly known as:  LANTUS Inject 12 Units into the skin at bedtime.   insulin lispro 100 UNIT/ML injection Commonly known as:  HUMALOG Inject into the skin 2 (two) times daily with a meal. Taking until out, then switching to Humalog 75/25 pen   Insulin Lispro Prot & Lispro (75-25) 100 UNIT/ML Kwikpen Commonly known as:  HUMALOG MIX 75/25 KWIKPEN Inject 10 Units into the skin 2 (two) times daily with a meal.   lisinopril 10 MG tablet Commonly known as:  PRINIVIL,ZESTRIL TAKE 1 TABLET BY MOUTH EVERY DAY   sodium chloride 0.65 % Soln nasal spray Commonly known as:  OCEAN Place 2 sprays into both nostrils every 2 (two) hours while awake.   venlafaxine XR 75 MG  24 hr capsule Commonly known as:  EFFEXOR-XR Take 1 capsule (75 mg total) by mouth daily with breakfast.       Allergies: No Known Allergies  Past Medical History:  Diagnosis Date  . Anxiety   . Chickenpox   . Depression   . Diabetes mellitus without complication ([place])   . Frequent headaches   . Hypertension   . Migraines     No past surgical history on file.  Family History  Problem Relation Age of Onset  . Hyperlipidemia Mother   . Mental illness Mother   . Hypertension Father   . Diabetes Father   . Mental illness Father   . Hyperlipidemia Maternal Grandmother   . Heart disease Paternal Grandfather   . Hypertension Paternal Grandfather   . Testicular cancer Paternal Grandfather     Social History:  reports that he has never smoked. He has never used smokeless tobacco. He reports that he does not drink alcohol. His drug history is not on file.      Review of Systems      Lipids:    LABS:  No visits with results within 1 Week(s) from this visit.  Latest known  visit with results is:  Office Visit on 10/13/2016  Component Date Value Ref Range Status  . Hgb A1c MFr Bld 10/13/2016 10.1* 4.6 - 6.5 % Final   Glycemic Control Guidelines for People with Diabetes:Non Diabetic:  <6%Goal of Therapy: <7%Additional Action Suggested:  >8%   . Direct LDL 10/13/2016 84.0  mg/dL Final   Optimal:  <100 mg/dLNear or Above Optimal:  100-129 mg/dLBorderline High:  130-159 mg/dLHigh:  160-189 mg/dLVery High:  >190 mg/dL    Physical Examination:  There were no vitals taken for this visit.  GENERAL:  HEENT:         Eye exam shows normal external appearance. Fundus exam shows no retinopathy. Oral exam shows normal mucosa .  NECK:         there is no lymphadenopathy.   Thyroid is not enlarged and no nodules felt.   LUNGS:         Chest is symmetrical. Lungs are clear to auscultation.Marland Kitchen   HEART:         Heart sounds:  S1 and S2 are normal. No murmurs or clicks heard., no S3 or S4.   ABDOMEN:  no distention present. Liver and spleen are not palpable. No other mass or tenderness present.  EXTREMITIES:     There is no edema. No skin lesions present.Marland Kitchen  NEUROLOGICAL:        Vibration sense is *** reduced in toes. Ankle jerks are normal bilaterally.      Diabetic Foot Exam - Simple   No data filed          MUSCULOSKELETAL:       There is no enlargement or deformity of the joints.  SKIN:       No rash, lesions or abnormal pigmentation       ASSESSMENT:  Diabetes type 1,  Problems identified:     Complications:  PLAN:   There are no Patient Instructions on file for this visit.    Prescious Hurless 12/08/2016, 8:09 AM   Note: This note was prepared with Dragon voice recognition system technology. Any transcriptional errors that result from this process are unintentional.

## 2016-12-22 ENCOUNTER — Ambulatory Visit: Payer: Self-pay | Admitting: Registered Nurse

## 2016-12-22 VITALS — BP 129/87 | HR 95 | Temp 96.7°F

## 2016-12-22 DIAGNOSIS — S46911A Strain of unspecified muscle, fascia and tendon at shoulder and upper arm level, right arm, initial encounter: Secondary | ICD-10-CM

## 2016-12-22 NOTE — Patient Instructions (Signed)
Cervical Strain and Sprain Rehab Ask your health care provider which exercises are safe for you. Do exercises exactly as told by your health care provider and adjust them as directed. It is normal to feel mild stretching, pulling, tightness, or discomfort as you do these exercises, but you should stop right away if you feel sudden pain or your pain gets worse.Do not begin these exercises until told by your health care provider. Stretching and range of motion exercises These exercises warm up your muscles and joints and improve the movement and flexibility of your neck. These exercises also help to relieve pain, numbness, and tingling. Exercise A: Cervical side bend  1. Using good posture, sit on a stable chair or stand up. 2. Without moving your shoulders, slowly tilt your left / right ear to your shoulder until you feel a stretch in your neck muscles. You should be looking straight ahead. 3. Hold for __________ seconds. 4. Repeat with the other side of your neck. Repeat __________ times. Complete this exercise __________ times a day. Exercise B: Cervical rotation  1. Using good posture, sit on a stable chair or stand up. 2. Slowly turn your head to the side as if you are looking over your left / right shoulder. ? Keep your eyes level with the ground. ? Stop when you feel a stretch along the side and the back of your neck. 3. Hold for __________ seconds. 4. Repeat this by turning to your other side. Repeat __________ times. Complete this exercise __________ times a day. Exercise C: Thoracic extension and pectoral stretch 1. Roll a towel or a small blanket so it is about 4 inches (10 cm) in diameter. 2. Lie down on your back on a firm surface. 3. Put the towel lengthwise, under your spine in the middle of your back. It should not be not under your shoulder blades. The towel should line up with your spine from your middle back to your lower back. 4. Put your hands behind your head and let your  elbows fall out to your sides. 5. Hold for __________ seconds. Repeat __________ times. Complete this exercise __________ times a day. Strengthening exercises These exercises build strength and endurance in your neck. Endurance is the ability to use your muscles for a long time, even after your muscles get tired. Exercise D: Upper cervical flexion, isometric 1. Lie on your back with a thin pillow behind your head and a small rolled-up towel under your neck. 2. Gently tuck your chin toward your chest and nod your head down to look toward your feet. Do not lift your head off the pillow. 3. Hold for __________ seconds. 4. Release the tension slowly. Relax your neck muscles completely before you repeat this exercise. Repeat __________ times. Complete this exercise __________ times a day. Exercise E: Cervical extension, isometric  1. Stand about 6 inches (15 cm) away from a wall, with your back facing the wall. 2. Place a soft object, about 6-8 inches (15-20 cm) in diameter, between the back of your head and the wall. A soft object could be a small pillow, a ball, or a folded towel. 3. Gently tilt your head back and press into the soft object. Keep your jaw and forehead relaxed. 4. Hold for __________ seconds. 5. Release the tension slowly. Relax your neck muscles completely before you repeat this exercise. Repeat __________ times. Complete this exercise __________ times a day. Posture and body mechanics  Body mechanics refers to the movements and positions of   your body while you do your daily activities. Posture is part of body mechanics. Good posture and healthy body mechanics can help to relieve stress in your body's tissues and joints. Good posture means that your spine is in its natural S-curve position (your spine is neutral), your shoulders are pulled back slightly, and your head is not tipped forward. The following are general guidelines for applying improved posture and body mechanics to  your everyday activities. Standing  When standing, keep your spine neutral and keep your feet about hip-width apart. Keep a slight bend in your knees. Your ears, shoulders, and hips should line up.  When you do a task in which you stand in one place for a long time, place one foot up on a stable object that is 2-4 inches (5-10 cm) high, such as a footstool. This helps keep your spine neutral. Sitting   When sitting, keep your spine neutral and your keep feet flat on the floor. Use a footrest, if necessary, and keep your thighs parallel to the floor. Avoid rounding your shoulders, and avoid tilting your head forward.  When working at a desk or a computer, keep your desk at a height where your hands are slightly lower than your elbows. Slide your chair under your desk so you are close enough to maintain good posture.  When working at a computer, place your monitor at a height where you are looking straight ahead and you do not have to tilt your head forward or downward to look at the screen. Resting When lying down and resting, avoid positions that are most painful for you. Try to support your neck in a neutral position. You can use a contour pillow or a small rolled-up towel. Your pillow should support your neck but not push on it. This information is not intended to replace advice given to you by your health care provider. Make sure you discuss any questions you have with your health care provider. Document Released: 03/28/2005 Document Revised: 12/03/2015 Document Reviewed: 03/04/2015 Elsevier Interactive Patient Education  2018 [employer]  

## 2016-12-22 NOTE — Progress Notes (Signed)
Subjective:    Patient ID: James Stanley, male    DOB: 07/29/1980, 36 y.o.   MRN: [id number]  35y/o established caucasian male Pt presents with c/o "rhomboid major muscle knot for weeks. It's still swollen and very inflamed and painful." Unknown injury or strain to back recently. Pain started gradually and has progressively worsened over 4 weeks. Has tried cold/hot alternating therapy, handheld massager, stretches, pressure points, ibuprofen at home. Patient has been holding tablet at night reading in bed and works on computer at work.  Right hand dominant.  Denied weakness arms/legs/hands, incontinence bowel/bladder, tingling/numbness/saddle paresthesias/trauma.      Review of Systems  Constitutional: Negative for activity change, appetite change, chills, diaphoresis, fatigue, fever and unexpected weight change.  HENT: Negative for trouble swallowing and voice change.   Eyes: Negative for photophobia and visual disturbance.  Respiratory: Negative for cough, shortness of breath, wheezing and stridor.   Cardiovascular: Negative for chest pain and leg swelling.  Gastrointestinal: Negative for abdominal distention, abdominal pain, blood in stool, diarrhea, nausea and vomiting.  Endocrine: Negative for cold intolerance and heat intolerance.  Genitourinary: Negative for difficulty urinating and dysuria.  Musculoskeletal: Positive for back pain and myalgias. Negative for arthralgias, gait problem, joint swelling, neck pain and neck stiffness.  Skin: Negative for color change, pallor, rash and wound.  Allergic/Immunologic: Positive for immunocompromised state. Negative for environmental allergies and food allergies.  Neurological: Negative for dizziness, tremors, seizures, syncope, facial asymmetry, speech difficulty, weakness, light-headedness, numbness and headaches.  Hematological: Negative for adenopathy. Does not bruise/bleed easily.  Psychiatric/Behavioral: Negative for agitation,  confusion and sleep disturbance. The patient is not nervous/anxious.        Objective:   Physical Exam  Constitutional: He is oriented to person, place, and time. Vital signs are normal. He appears well-developed and well-nourished. He is cooperative.  Non-toxic appearance. He does not have a sickly appearance. He does not appear ill. No distress.  HENT:  Head: Normocephalic and atraumatic.  Right Ear: Hearing and external ear normal.  Left Ear: Hearing and external ear normal.  Nose: Nose normal.  Mouth/Throat: Uvula is midline and mucous membranes are normal. No oral lesions. No trismus in the jaw. No uvula swelling. Posterior oropharyngeal edema and posterior oropharyngeal erythema present. No oropharyngeal exudate or tonsillar abscesses.  Bilateral allergic shiners; cobblestoning posterior pharynx  Eyes: Pupils are equal, round, and reactive to light. Conjunctivae, EOM and lids are normal. Right eye exhibits no discharge. Left eye exhibits no discharge. No scleral icterus.  Neck: Trachea normal, normal range of motion and phonation normal. Neck supple. Muscular tenderness present. No tracheal tenderness and no spinous process tenderness present. No neck rigidity. No tracheal deviation, no edema, no erythema and normal range of motion present. No thyromegaly present.    Cardiovascular: Normal rate, regular rhythm, normal heart sounds and intact distal pulses.   Pulses:      Radial pulses are 2+ on the right side, and 2+ on the left side.  Pulmonary/Chest: Effort normal and breath sounds normal. No accessory muscle usage or stridor. No respiratory distress. He has no decreased breath sounds. He has no wheezes. He has no rhonchi. He has no rales.  Abdominal: Normal appearance. He exhibits no distension, no fluid wave and no ascites. There is no rigidity and no guarding.  Musculoskeletal: He exhibits tenderness. He exhibits no edema or deformity.       Right shoulder: Normal.       Left  shoulder: Normal.  Right elbow: Normal.      Left elbow: Normal.       Right wrist: Normal.       Left wrist: Normal.       Right hip: Normal.       Left hip: Normal.       Right knee: Normal.       Left knee: Normal.       Right ankle: Normal.       Left ankle: Normal.       Cervical back: He exhibits tenderness and spasm. He exhibits normal range of motion, no bony tenderness, no swelling, no edema, no deformity, no laceration, no pain and normal pulse.       Thoracic back: He exhibits tenderness and pain. He exhibits normal range of motion, no bony tenderness, no swelling, no edema, no deformity, no laceration, no spasm and normal pulse.       Lumbar back: He exhibits normal range of motion, no tenderness, no bony tenderness, no swelling, no edema, no deformity, no laceration, no pain, no spasm and normal pulse.       Back:       Right upper arm: Normal.       Left upper arm: Normal.       Right forearm: Normal.       Left forearm: Normal.       Right hand: Normal.       Left hand: Normal.  Bilateral trapezius tight TTP; right scapular tight TTP centrally; pain with cross body reach right scapula  Lymphadenopathy:       Head (right side): No submental, no submandibular, no tonsillar, no preauricular, no posterior auricular and no occipital adenopathy present.       Head (left side): No submental, no submandibular, no tonsillar, no preauricular, no posterior auricular and no occipital adenopathy present.    He has no cervical adenopathy.       Right cervical: No superficial cervical, no deep cervical and no posterior cervical adenopathy present.      Left cervical: No superficial cervical, no deep cervical and no posterior cervical adenopathy present.  Neurological: He is alert and oriented to person, place, and time. He has normal strength. He is not disoriented. He displays no atrophy, no tremor and normal reflexes. No cranial nerve deficit or sensory deficit. He exhibits normal  muscle tone. He displays no seizure activity. Coordination and gait normal. GCS eye subscore is 4. GCS verbal subscore is 5. GCS motor subscore is 6.  Reflex Scores:      Brachioradialis reflexes are 2+ on the right side and 2+ on the left side.      Patellar reflexes are 2+ on the right side and 2+ on the left side.      Achilles reflexes are 2+ on the right side and 2+ on the left side. Bilateral hand grasp equal bilaterally 5/5; on/off exam table; in/out of chair without difficulty; gait sure and steady in hall  Skin: Skin is warm, dry and intact. No abrasion, no bruising, no burn, no ecchymosis, no laceration, no lesion, no petechiae and no rash noted. He is not diaphoretic. No cyanosis or erythema. No pallor. Nails show no clubbing.  Psychiatric: He has a normal mood and affect. His speech is normal and behavior is normal. Judgment and thought content normal. Cognition and memory are normal.  Nursing note and vitals reviewed.  Applied thermacare patch from clinic stock to right scapular central region.  Assessment & Plan:  A-muscle strain right scapular region, initial visit  P-heat/cryotherapy 15 minutes QID prn pain/spasms; consider epsom salt soak/hot shower; biofreeze gel 4% UD pkgs x 4 given to patient from clinic stock.  Did not want flexeril.  Has tylenol and motrin at home for prn use.  Motrin 800mg  po TID prn pain and tylenol 1000mg  po QID prn pain max dosing.  Demonstrated stretches/massage.  Discussed home and work ergonomics has been holding tablet at night to read at home.  Use lap test instead of holding with hands.  Home stretches demonstrated to patient-e.g. Arm circles, walking up wall, chest stretches, neck AROM, chin tucks, knee to chest and rock side to side on back.  Consider physical therapy referral if no improvement with prescribed therapy.  Ensure ergonomics correct desk at work avoid repetitive motions if possible/holding phone/laptop in hand use desk/stand.   Patient was instructed to rest, ice, and ROM exercises.  Activity as tolerated.  Follow up if symptoms persist or worsen.  Exitcare handout on cervical strain with rehab exercises and posture/body mechanics given to patient.  Patient verbalized agreement and understanding of treatment plan.  P2:  In[employer].

## 2016-12-23 MED ORDER — ACETAMINOPHEN 500 MG PO TABS: 1000.0000 mg | ORAL_TABLET | Freq: Four times a day (QID) | ORAL | 0 refills | Status: AC | PRN

## 2016-12-23 MED ORDER — MENTHOL (TOPICAL ANALGESIC) 4 % EX GEL: 1.0000 "application " | Freq: Four times a day (QID) | CUTANEOUS | 0 refills | Status: AC | PRN

## 2017-03-10 ENCOUNTER — Other Ambulatory Visit: Payer: Self-pay | Admitting: Primary Care

## 2017-03-10 ENCOUNTER — Other Ambulatory Visit: Payer: Self-pay | Admitting: Family Medicine

## 2017-03-10 DIAGNOSIS — I10 Essential (primary) hypertension: Secondary | ICD-10-CM

## 2017-03-10 NOTE — Telephone Encounter (Signed)
Last filled:  12/02/16, #30 Last OV:  10/13/16 Next OV:  None  Was not sure if I send this to you or Anda Kraft.

## 2017-03-17 ENCOUNTER — Other Ambulatory Visit: Payer: Self-pay | Admitting: Primary Care

## 2017-03-17 DIAGNOSIS — I10 Essential (primary) hypertension: Secondary | ICD-10-CM

## 2017-03-17 MED ORDER — LISINOPRIL 10 MG PO TABS: 10.0000 mg | ORAL_TABLET | Freq: Every day | ORAL | 0 refills | Status: DC

## 2017-04-13 ENCOUNTER — Ambulatory Visit: Payer: No Typology Code available for payment source | Admitting: Primary Care

## 2017-04-13 ENCOUNTER — Telehealth: Payer: Self-pay | Admitting: Primary Care

## 2017-04-13 ENCOUNTER — Other Ambulatory Visit: Payer: Self-pay | Admitting: Primary Care

## 2017-04-13 ENCOUNTER — Encounter: Payer: Self-pay | Admitting: Primary Care

## 2017-04-13 VITALS — BP 120/78 | HR 68 | Temp 97.7°F | Ht 68.5 in | Wt 150.8 lb

## 2017-04-13 DIAGNOSIS — E1065 Type 1 diabetes mellitus with hyperglycemia: Secondary | ICD-10-CM

## 2017-04-13 DIAGNOSIS — F329 Major depressive disorder, single episode, unspecified: Secondary | ICD-10-CM | POA: Diagnosis not present

## 2017-04-13 DIAGNOSIS — F419 Anxiety disorder, unspecified: Secondary | ICD-10-CM | POA: Diagnosis not present

## 2017-04-13 DIAGNOSIS — E785 Hyperlipidemia, unspecified: Secondary | ICD-10-CM

## 2017-04-13 DIAGNOSIS — F32A Depression, unspecified: Secondary | ICD-10-CM

## 2017-04-13 DIAGNOSIS — I10 Essential (primary) hypertension: Secondary | ICD-10-CM | POA: Diagnosis not present

## 2017-04-13 DIAGNOSIS — D229 Melanocytic nevi, unspecified: Secondary | ICD-10-CM | POA: Diagnosis not present

## 2017-04-13 LAB — COMPREHENSIVE METABOLIC PANEL
ALBUMIN: 4.6 g/dL (ref 3.5–5.2)
ALK PHOS: 65 U/L (ref 39–117)
ALT: 14 U/L (ref 0–53)
AST: 16 U/L (ref 0–37)
BUN: 5 mg/dL — AB (ref 6–23)
CHLORIDE: 92 meq/L — AB (ref 96–112)
CO2: 30 mEq/L (ref 19–32)
Calcium: 9.4 mg/dL (ref 8.4–10.5)
Creatinine, Ser: 0.78 mg/dL (ref 0.40–1.50)
GFR: 120.19 mL/min (ref >60.00)
Glucose, Bld: 234 mg/dL — ABNORMAL HIGH (ref 70–99)
POTASSIUM: 4.6 meq/L (ref 3.5–5.1)
SODIUM: 128 meq/L — AB (ref 135–145)
TOTAL PROTEIN: 6.7 g/dL (ref 6.0–8.3)
Total Bilirubin: 0.6 mg/dL (ref 0.2–1.2)

## 2017-04-13 LAB — LIPID PANEL
CHOLESTEROL: 139 mg/dL (ref 0–200)
HDL: 58.1 mg/dL (ref >39.00)
LDL Cholesterol: 67 mg/dL (ref 0–99)
NONHDL: 81.07
TRIGLYCERIDES: 70 mg/dL (ref 0.0–149.0)
Total CHOL/HDL Ratio: 2
VLDL: 14 mg/dL (ref 0.0–40.0)

## 2017-04-13 LAB — HEMOGLOBIN A1C: HEMOGLOBIN A1C: 9.2 % — AB (ref 4.6–6.5)

## 2017-04-13 MED ORDER — INSULIN LISPRO 100 UNIT/ML ~~LOC~~ SOLN: 4.0000 [IU] | Freq: Three times a day (TID) | SUBCUTANEOUS | 0 refills | Status: DC

## 2017-04-13 MED ORDER — VENLAFAXINE HCL ER 75 MG PO CP24: 75.0000 mg | ORAL_CAPSULE | Freq: Every day | ORAL | 3 refills | Status: DC

## 2017-04-13 MED ORDER — INSULIN GLARGINE 100 UNIT/ML SOLOSTAR PEN: 12.0000 [IU] | PEN_INJECTOR | Freq: Every day | SUBCUTANEOUS | 0 refills | Status: DC

## 2017-04-13 MED ORDER — LISINOPRIL 10 MG PO TABS: 10.0000 mg | ORAL_TABLET | Freq: Every day | ORAL | 3 refills | Status: DC

## 2017-04-13 MED ORDER — ATORVASTATIN CALCIUM 40 MG PO TABS: 40.0000 mg | ORAL_TABLET | Freq: Every evening | ORAL | 3 refills | Status: DC

## 2017-04-13 MED ORDER — BASAGLAR KWIKPEN 100 UNIT/ML ~~LOC~~ SOPN: 12.0000 [IU] | PEN_INJECTOR | Freq: Every day | SUBCUTANEOUS | 0 refills | Status: DC

## 2017-04-13 NOTE — Assessment & Plan Note (Signed)
Repeat lipids pending. Continue atorvastatin.  

## 2017-04-13 NOTE — Telephone Encounter (Signed)
Copied from [place] [id number]. Topic: Quick Communication - See Telephone Encounter >> Apr 13, 2017  3:25 PM Ether Griffins B wrote: CRM for notification. See Telephone encounter for:  Pt wanting to know if another long lasting insulin could be called in due to the cost of the lantus being high  04/13/17.

## 2017-04-13 NOTE — Progress Notes (Signed)
Subjective:    Patient ID: James Stanley, male    DOB: 06-08-1980, 37 y.o.   MRN: [id number]  HPI  Mr. Belvedere is a 37 year old male who presents today for follow up. He would also like a referral to see a dermatologist for body check given numerous nevi.  1) Essential Hypertension: Currently managed on lisinopril 10 mg. He denies chest pain, dizziness, headaches.  BP Readings from Last 3 Encounters:  04/13/17 120/78  12/22/16 129/87  10/13/16 116/82     2) Hyperlipidemia: Currently managed on atorvastatin 40 mg. Lipid panel in March 2018 with TC of 266, LDl of 192. He is fasting today. He is compliant to his atorvastatin.  3) Type 1 Diabetes:   Current medications include: Lantus 12 units and Humalog 4-6 units three times daily with meal. He was referred to endocrinology in July 2018, has not established. He ran out of his Lantus 2 weeks ago and has noticed hyperglycemia.  He is checking his blood glucose 6 times daily and is getting readings of: AM fasting: On insulin high 100's to low 200's.  Early morning: 300's Afternoon Before Lunch: same as AM fasting. Late Afternoon: 300's. Before dinner: high 100's to low 200's  Last A1C: 10.1 in July 2018. Last Eye Exam: Due now, no recent exam. Pneumonia Vaccination: Due. ACE/ARB: ACE Statin: Atorvastatin   Diet currently consists of:  Breakfast: Trail mix Lunch: Sub sandwiches, wraps Dinner: Meat, vegetable, starch (mac and cheese, breads) Snacks: Not usually Desserts: Tries to avoid Beverages: Coffee, water  Exercise: He is not currently exercising.    4) Anxiety and Depression: Currently managed on venlafaxine XR 75 mg. Overall feels well managed on currently regimen. He is needing refills.    Review of Systems  Constitutional: Negative for fatigue.  Respiratory: Negative for shortness of breath.   Cardiovascular: Negative for chest pain.  Endocrine: Negative for polyuria.  Neurological: Negative for  dizziness, numbness and headaches.       Past Medical History:  Diagnosis Date  . Anxiety   . Chickenpox   . Depression   . Diabetes mellitus without complication ([place])   . Frequent headaches   . Hypertension   . Migraines      Social History   Socioeconomic History  . Marital status: Single    Spouse name: Not on file  . Number of children: Not on file  . Years of education: Not on file  . Highest education level: Not on file  Social Needs  . Financial resource strain: Not on file  . Food insecurity - worry: Not on file  . Food insecurity - inability: Not on file  . Transportation needs - medical: Not on file  . Transportation needs - non-medical: Not on file  Occupational History  . Not on file  Tobacco Use  . Smoking status: Never Smoker  . Smokeless tobacco: Never Used  Substance and Sexual Activity  . Alcohol use: No  . Drug use: Not on file  . Sexual activity: Not on file  Other Topics Concern  . Not on file  Social History Narrative   Single.   Works at [employer].   Moved from [place].   Enjoys reading.    No past surgical history on file.  Family History  Problem Relation Age of Onset  . Hyperlipidemia Mother   . Mental illness Mother   . Hypertension Father   . Diabetes Father   . Mental illness Father   .  Hyperlipidemia Maternal Grandmother   . Heart disease Paternal Grandfather   . Hypertension Paternal Grandfather   . Testicular cancer Paternal Grandfather     No Known Allergies  Current Outpatient Medications on File Prior to Visit  Medication Sig Dispense Refill  . aspirin-acetaminophen-caffeine (EXCEDRIN MIGRAINE) 250-250-65 MG tablet Take by mouth every 6 (six) hours as needed for headache.    Marland Kitchen atorvastatin (LIPITOR) 40 MG tablet Take 1 tablet (40 mg total) by mouth every evening. 90 tablet 3   No current facility-administered medications on file prior to visit.     BP 120/78   Pulse 68   Temp 97.7 F (36.5 C) (Oral)    Ht 5' 8.5" (1.74 m)   Wt 150 lb 12.8 oz (68.4 kg)   SpO2 97%   BMI 22.60 kg/m    Objective:   Physical Exam  Constitutional: He appears well-nourished.  Neck: Neck supple.  Cardiovascular: Normal rate and regular rhythm.  Pulmonary/Chest: Effort normal and breath sounds normal.  Skin: Skin is warm and dry.          Assessment & Plan:

## 2017-04-13 NOTE — Patient Instructions (Signed)
Stop by the lab prior to leaving today. I will notify you of your results once received.   Stop by the front desk and speak with either Rosaria Ferries or Shirlean Mylar regarding your referral to endocrinology. You will be called regarding your referrals to dermatology and ophthalmology.   I sent refills for your other medications to the pharmacy.  Work on regular exercise and a healthy diet.  It was a pleasure to see you today!

## 2017-04-13 NOTE — Assessment & Plan Note (Signed)
Doing well on current regimen, refills provided today.

## 2017-04-13 NOTE — Assessment & Plan Note (Signed)
Stable in the office today, continue lisinopril. BMP pending.  

## 2017-04-13 NOTE — Telephone Encounter (Signed)
Please notify patient that I sent in another brand of long acting insulin called basaglar to see if this is more affordable. Please have him update. Same instructions on use.

## 2017-04-13 NOTE — Assessment & Plan Note (Signed)
A1C pending, likely to be above goal given that he's been out of Lantus x 2 weeks. Strongly encouraged he get connected with an endocrinologist, referral placed today. Refills provided for Lantus and Humalog.  Pneumovax due, forgot to administer today he will need this in the future.  Managed on ACE and statin. Referral placed for opthalmology for diabetic eye exam.

## 2017-04-14 ENCOUNTER — Telehealth: Payer: Self-pay | Admitting: Primary Care

## 2017-04-14 NOTE — Telephone Encounter (Signed)
Message left for patient to return my call.  

## 2017-04-14 NOTE — Telephone Encounter (Signed)
Copied from [place] [id number]. Topic: Quick Communication - See Telephone Encounter >> Apr 14, 2017 12:54 PM Vernona Rieger wrote: CRM for notification. See Telephone encounter for:   04/14/17.  Pt just wants to know if he can get another insulin instead of the LANTUS, he states its very expensive.  Call back is [phone] [id number] - [place], [place]

## 2017-04-14 NOTE — Telephone Encounter (Signed)
Please see phone note from yesterday, I sent in something different called basaglar, let me know.

## 2017-04-17 NOTE — Telephone Encounter (Signed)
Per DPR, left detail message of Kate's comments.

## 2017-04-18 ENCOUNTER — Telehealth: Payer: Self-pay

## 2017-04-18 NOTE — Telephone Encounter (Signed)
Copied from [place] [phone]. Topic: General - Call Back - No Documentation >> Apr 17, 2017  5:46 PM James Stanley, NT wrote: Reason for CRM: patient states he just missed a call from the office. I was unaware to find any documenting on who called. Advised pt that the office is closed. He states he will try and call back tomorrow. Thank you

## 2017-04-19 ENCOUNTER — Encounter: Payer: Self-pay | Admitting: Primary Care

## 2017-04-19 NOTE — Telephone Encounter (Signed)
The last note says Vallarie Mare left him a detailed message about the Insulin

## 2017-06-20 ENCOUNTER — Other Ambulatory Visit: Payer: Self-pay | Admitting: Primary Care

## 2017-06-20 DIAGNOSIS — E1065 Type 1 diabetes mellitus with hyperglycemia: Secondary | ICD-10-CM

## 2017-07-07 DIAGNOSIS — E1065 Type 1 diabetes mellitus with hyperglycemia: Secondary | ICD-10-CM | POA: Insufficient documentation

## 2017-08-07 ENCOUNTER — Ambulatory Visit: Payer: No Typology Code available for payment source | Admitting: Primary Care

## 2017-08-07 ENCOUNTER — Encounter: Payer: Self-pay | Admitting: Primary Care

## 2017-08-07 VITALS — BP 120/78 | HR 79 | Temp 97.9°F | Ht 68.5 in | Wt 149.8 lb

## 2017-08-07 DIAGNOSIS — F32A Depression, unspecified: Secondary | ICD-10-CM

## 2017-08-07 DIAGNOSIS — F329 Major depressive disorder, single episode, unspecified: Secondary | ICD-10-CM | POA: Diagnosis not present

## 2017-08-07 DIAGNOSIS — F419 Anxiety disorder, unspecified: Secondary | ICD-10-CM

## 2017-08-07 MED ORDER — VENLAFAXINE HCL ER 150 MG PO CP24: 150.0000 mg | ORAL_CAPSULE | Freq: Every day | ORAL | 1 refills | Status: DC

## 2017-08-07 NOTE — Assessment & Plan Note (Signed)
Deteriorated with GAD 7 score of 12 and PHQ 9 score of 13 today. Denies SI/HI. Will trial an increase dose of Effexor to 150 mg daily. Will have him follow up in 4-6 weeks for re-evaluation.

## 2017-08-07 NOTE — Progress Notes (Signed)
Subjective:    Patient ID: James Stanley, male    DOB: 1981/01/15, 37 y.o.   MRN: [id number]  HPI  James Stanley is a 37 year old male with a history of anxiety and depression who presents today to discuss depression.  He is currently managed on venlafaxine XR 75 mg capsules. Previously managed on Celexa 40 mg and Clonazepam PRN years ago.   Since his last visit he's noticed feeling lightheadedness, feeling nervous at work, an intermittent "surge" of wanting to cry. He has felt this way in the past when he's accidentally missed a dose or two of his medication. This is occurring more frequently despite daily compliance to medication. Symptoms have been present for the past 2 months.  GAD 7 score of 12 and PHQ 9 score of 13 today. He denies SI/HI.  Review of Systems  Constitutional: Positive for fatigue.  Respiratory: Negative for shortness of breath.   Cardiovascular: Negative for chest pain.  Neurological: Positive for light-headedness.  Psychiatric/Behavioral:       See HPI       Past Medical History:  Diagnosis Date  . Anxiety   . Chickenpox   . Depression   . Diabetes mellitus without complication ([place])   . Frequent headaches   . Hypertension   . Migraines      Social History   Socioeconomic History  . Marital status: Single    Spouse name: Not on file  . Number of children: Not on file  . Years of education: Not on file  . Highest education level: Not on file  Occupational History  . Not on file  Social Needs  . Financial resource strain: Not on file  . Food insecurity:    Worry: Not on file    Inability: Not on file  . Transportation needs:    Medical: Not on file    Non-medical: Not on file  Tobacco Use  . Smoking status: Never Smoker  . Smokeless tobacco: Never Used  Substance and Sexual Activity  . Alcohol use: No  . Drug use: Not on file  . Sexual activity: Not on file  Lifestyle  . Physical activity:    Days per week: Not on file    Minutes per  session: Not on file  . Stress: Not on file  Relationships  . Social connections:    Talks on phone: Not on file    Gets together: Not on file    Attends religious service: Not on file    Active member of club or organization: Not on file    Attends meetings of clubs or organizations: Not on file    Relationship status: Not on file  . Intimate partner violence:    Fear of current or ex partner: Not on file    Emotionally abused: Not on file    Physically abused: Not on file    Forced sexual activity: Not on file  Other Topics Concern  . Not on file  Social History Narrative   Single.   Works at [employer].   Moved from [place].   Enjoys reading.    No past surgical history on file.  Family History  Problem Relation Age of Onset  . Hyperlipidemia Mother   . Mental illness Mother   . Hypertension Father   . Diabetes Father   . Mental illness Father   . Hyperlipidemia Maternal Grandmother   . Heart disease Paternal Grandfather   . Hypertension Paternal Grandfather   .  Testicular cancer Paternal Grandfather     No Known Allergies  Current Outpatient Medications on File Prior to Visit  Medication Sig Dispense Refill  . aspirin-acetaminophen-caffeine (EXCEDRIN MIGRAINE) 250-250-65 MG tablet Take by mouth every 6 (six) hours as needed for headache.    Marland Kitchen atorvastatin (LIPITOR) 40 MG tablet Take 1 tablet (40 mg total) by mouth every evening. 90 tablet 3  . HUMALOG 100 UNIT/ML injection INJECT 0.04-0.06 MLS (4-6 UNITS TOTAL) INTO THE SKIN 3 (THREE) TIMES DAILY WITH MEALS. 10 mL 0  . lisinopril (PRINIVIL,ZESTRIL) 10 MG tablet Take 1 tablet (10 mg total) by mouth daily. 90 tablet 3  . TOUJEO SOLOSTAR 300 UNIT/ML SOPN      No current facility-administered medications on file prior to visit.     BP 120/78   Pulse 79   Temp 97.9 F (36.6 C) (Oral)   Ht 5' 8.5" (1.74 m)   Wt 149 lb 12 oz (67.9 kg)   SpO2 97%   BMI 22.44 kg/m    Objective:   Physical Exam    Constitutional: He appears well-nourished.  Cardiovascular: Normal rate.  Pulmonary/Chest: Effort normal.  Psychiatric: He has a normal mood and affect.          Assessment & Plan:

## 2017-08-07 NOTE — Patient Instructions (Signed)
We've increased your venlafaxine dose from 75 mg to 150 mg. Take 150 mg once daily with breakfast.  Please schedule a follow up visit in 4-6 weeks for re-evaluation, or call sooner if needed.  It was a pleasure to see you today!

## 2017-09-15 ENCOUNTER — Other Ambulatory Visit: Payer: Self-pay | Admitting: Family Medicine

## 2017-10-02 NOTE — Telephone Encounter (Signed)
error 

## 2017-10-06 ENCOUNTER — Ambulatory Visit: Payer: No Typology Code available for payment source | Admitting: Family

## 2017-10-06 ENCOUNTER — Encounter: Payer: Self-pay | Admitting: Family

## 2017-10-06 VITALS — BP 124/78 | HR 100 | Temp 98.2°F | Ht 68.5 in | Wt 138.1 lb

## 2017-10-06 DIAGNOSIS — K0889 Other specified disorders of teeth and supporting structures: Secondary | ICD-10-CM

## 2017-10-06 MED ORDER — AMOXICILLIN 500 MG PO CAPS: 500.0000 mg | ORAL_CAPSULE | Freq: Three times a day (TID) | ORAL | 0 refills | Status: DC

## 2017-10-06 NOTE — Progress Notes (Signed)
James Stanley is a 37 y.o. male with the following history as recorded in Epic[facility]:  Patient Active Problem List   Diagnosis Date Noted  . Anxiety and depression 06/14/2016  . Essential hypertension 06/14/2016  . Diabetes mellitus type 1, uncontrolled, insulin dependent ([place]) 06/14/2016  . Hyperlipidemia 06/14/2016    Current Outpatient Medications  Medication Sig Dispense Refill  . aspirin-acetaminophen-caffeine (EXCEDRIN MIGRAINE) 250-250-65 MG tablet Take by mouth every 6 (six) hours as needed for headache.    Marland Kitchen atorvastatin (LIPITOR) 40 MG tablet Take 1 tablet (40 mg total) by mouth every evening. 90 tablet 3  . Continuous Blood Gluc Receiver (FREESTYLE LIBRE 14 DAY READER) DEVI Use 1 each as needed .  Use to test blood sugar    . HUMALOG 100 UNIT/ML injection INJECT 0.04-0.06 MLS (4-6 UNITS TOTAL) INTO THE SKIN 3 (THREE) TIMES DAILY WITH MEALS. 10 mL 0  . lisinopril (PRINIVIL,ZESTRIL) 10 MG tablet Take 1 tablet (10 mg total) by mouth daily. 90 tablet 3  . TOUJEO SOLOSTAR 300 UNIT/ML SOPN     . venlafaxine XR (EFFEXOR XR) 150 MG 24 hr capsule Take 1 capsule (150 mg total) by mouth daily with breakfast. 30 capsule 1  . amoxicillin (AMOXIL) 500 MG capsule Take 1 capsule (500 mg total) by mouth 3 (three) times daily. 30 capsule 0   No current facility-administered medications for this visit.     Allergies: Patient has no known allergies.  Past Medical History:  Diagnosis Date  . Anxiety   . Chickenpox   . Depression   . Diabetes mellitus without complication ([place])   . Frequent headaches   . Hypertension   . Migraines     History reviewed. No pertinent surgical history.  Family History  Problem Relation Age of Onset  . Hyperlipidemia Mother   . Mental illness Mother   . Hypertension Father   . Diabetes Father   . Mental illness Father   . Hyperlipidemia Maternal Grandmother   . Heart disease Paternal Grandfather   . Hypertension Paternal Grandfather   . Testicular  cancer Paternal Grandfather     Social History   Tobacco Use  . Smoking status: Never Smoker  . Smokeless tobacco: Never Used  Substance Use Topics  . Alcohol use: No    Subjective:  Patient presents today with concerns for left sided dental infection- known history of impacted wisdom teeth; feeling some pain and swelling in his mouth in the past few days; worried that he will develop a worsening infection over the next week while his dentist office is closed; similar symptoms in July 2018- oral surgeon was recommended at that time/ has not been able to schedule yet; planning to see his dentist in the next 2 weeks; no fever; blood sugars are stable; asking if he can get a prescription for an antibiotic until he is able to see his dentist after the upcoming July 4th holiday.   Objective:  Vitals:   10/06/17 1513  BP: 124/78  Pulse: 100  Temp: 98.2 F (36.8 C)  TempSrc: Oral  SpO2: 97%  Weight: 138 lb 1.3 oz (62.6 kg)  Height: 5' 8.5" (1.74 m)    General: Well developed, well nourished, in no acute distress  Skin : Warm and dry.  Head: Normocephalic and atraumatic  Oropharynx: Pink, supple. No suspicious lesions; wisdom teeth noted; swelling/ erythema noted in upper left side of mouth; Neck: Supple without thyromegaly, adenopathy  Lungs: Respirations unlabored; clear to auscultation bilaterally without wheeze,  rales, rhonchi  CVS exam: normal rate and regular rhythm.  Neurologic: Alert and oriented; speech intact; face symmetrical; moves all extremities well; CNII-XII intact without focal deficit   Assessment:  1. Pain, dental     Plan:  Rx for Amoxicillin 500 mg tid x 10 days; stressed importance of seeing his dentist and oral surgeon; he agrees and will make a follow-up appointment.   No follow-ups on file.  No orders of the defined types were placed in this encounter.   Requested Prescriptions   Signed Prescriptions Disp Refills  . amoxicillin (AMOXIL) 500 MG capsule 30  capsule 0    Sig: Take 1 capsule (500 mg total) by mouth 3 (three) times daily.

## 2017-10-10 ENCOUNTER — Ambulatory Visit: Payer: No Typology Code available for payment source | Admitting: Family Medicine

## 2017-10-13 ENCOUNTER — Other Ambulatory Visit: Payer: Self-pay | Admitting: Primary Care

## 2017-10-13 DIAGNOSIS — F329 Major depressive disorder, single episode, unspecified: Secondary | ICD-10-CM

## 2017-10-13 DIAGNOSIS — F32A Depression, unspecified: Secondary | ICD-10-CM

## 2017-10-13 DIAGNOSIS — F419 Anxiety disorder, unspecified: Principal | ICD-10-CM

## 2017-11-13 ENCOUNTER — Other Ambulatory Visit: Payer: Self-pay | Admitting: Primary Care

## 2017-11-13 DIAGNOSIS — F32A Depression, unspecified: Secondary | ICD-10-CM

## 2017-11-13 DIAGNOSIS — F329 Major depressive disorder, single episode, unspecified: Secondary | ICD-10-CM

## 2017-11-13 DIAGNOSIS — F419 Anxiety disorder, unspecified: Principal | ICD-10-CM

## 2017-11-30 ENCOUNTER — Ambulatory Visit: Payer: Self-pay | Admitting: *Deleted

## 2017-11-30 VITALS — BP 126/84 | HR 113 | Ht 68.0 in | Wt 129.0 lb

## 2017-11-30 DIAGNOSIS — E785 Hyperlipidemia, unspecified: Secondary | ICD-10-CM

## 2017-11-30 DIAGNOSIS — I10 Essential (primary) hypertension: Secondary | ICD-10-CM

## 2017-11-30 DIAGNOSIS — E1065 Type 1 diabetes mellitus with hyperglycemia: Secondary | ICD-10-CM

## 2017-11-30 NOTE — Progress Notes (Signed)
[employer] insurance premium discount evaluation: Labs Drawn. Replacements ROI form signed. Tobacco Free Attestation form signed.  Forms placed in paper chart.  Okay to route results to pcp and endocrinology.

## 2017-12-01 LAB — CMP12+LP+TP+TSH+6AC+CBC/D/PLT
A/G RATIO: 2.5 — AB (ref 1.2–2.2)
ALBUMIN: 4.8 g/dL (ref 3.5–5.5)
ALK PHOS: 83 IU/L (ref 39–117)
ALT: 11 IU/L (ref 0–44)
AST: 18 IU/L (ref 0–40)
BASOS ABS: 0 10*3/uL (ref 0.0–0.2)
BASOS: 0 %
BUN / CREAT RATIO: 6 — AB (ref 9–20)
BUN: 5 mg/dL — ABNORMAL LOW (ref 6–20)
Bilirubin Total: 0.4 mg/dL (ref 0.0–1.2)
CHLORIDE: 100 mmol/L (ref 96–106)
CHOLESTEROL TOTAL: 151 mg/dL (ref 100–199)
Calcium: 9.7 mg/dL (ref 8.7–10.2)
Chol/HDL Ratio: 1.9 ratio (ref 0.0–5.0)
Creatinine, Ser: 0.8 mg/dL (ref 0.76–1.27)
EOS (ABSOLUTE): 0.2 10*3/uL (ref 0.0–0.4)
EOS: 3 %
Estimated CHD Risk: <0.5 times avg. (ref 0.0–1.0)
FREE THYROXINE INDEX: 1.5 (ref 1.2–4.9)
GFR calc Af Amer: 134 mL/min/{1.73_m2} (ref >59)
GFR calc non Af Amer: 116 mL/min/{1.73_m2} (ref >59)
GGT: 20 IU/L (ref 0–65)
Globulin, Total: 1.9 g/dL (ref 1.5–4.5)
Glucose: 174 mg/dL — ABNORMAL HIGH (ref 65–99)
HDL: 78 mg/dL (ref >39)
HEMATOCRIT: 41.4 % (ref 37.5–51.0)
HEMOGLOBIN: 14.3 g/dL (ref 13.0–17.7)
IMMATURE GRANS (ABS): 0 10*3/uL (ref 0.0–0.1)
IMMATURE GRANULOCYTES: 0 %
IRON: 146 ug/dL (ref 38–169)
LDH: 153 IU/L (ref 121–224)
LDL CALC: 61 mg/dL (ref 0–99)
LYMPHS ABS: 2.1 10*3/uL (ref 0.7–3.1)
LYMPHS: 43 %
MCH: 30.6 pg (ref 26.6–33.0)
MCHC: 34.5 g/dL (ref 31.5–35.7)
MCV: 89 fL (ref 79–97)
Monocytes Absolute: 0.3 10*3/uL (ref 0.1–0.9)
Monocytes: 6 %
NEUTROS PCT: 48 %
Neutrophils Absolute: 2.3 10*3/uL (ref 1.4–7.0)
PLATELETS: 277 10*3/uL (ref 150–450)
Phosphorus: 3.4 mg/dL (ref 2.5–4.5)
Potassium: 4.1 mmol/L (ref 3.5–5.2)
RBC: 4.68 x10E6/uL (ref 4.14–5.80)
RDW: 13.3 % (ref 12.3–15.4)
SODIUM: 139 mmol/L (ref 134–144)
T3 UPTAKE RATIO: 22 % — AB (ref 24–39)
T4, Total: 6.6 ug/dL (ref 4.5–12.0)
TRIGLYCERIDES: 62 mg/dL (ref 0–149)
TSH: 2 u[IU]/mL (ref 0.450–4.500)
Total Protein: 6.7 g/dL (ref 6.0–8.5)
Uric Acid: 3.7 mg/dL (ref 3.7–8.6)
VLDL CHOLESTEROL CAL: 12 mg/dL (ref 5–40)
WBC: 4.9 10*3/uL (ref 3.4–10.8)

## 2017-12-01 LAB — HGB A1C W/O EAG: Hgb A1c MFr Bld: 10.3 % — ABNORMAL HIGH (ref 4.8–5.6)

## 2017-12-04 NOTE — Progress Notes (Signed)
Pt's work phone line busy on multiple times RN attempted to contact pt Friday 8/23. LVM on individual work line to return call today to review results. Pt reported last week that his MyChart was active, however it showed as code expired today, so code reset and and email resent to activate MyChart. Pt made aware of same in order to access results online as he reported he preferred to last week.

## 2017-12-13 ENCOUNTER — Emergency Department: Payer: No Typology Code available for payment source

## 2017-12-13 ENCOUNTER — Other Ambulatory Visit: Payer: Self-pay

## 2017-12-13 ENCOUNTER — Ambulatory Visit: Payer: Self-pay | Admitting: Primary Care

## 2017-12-13 ENCOUNTER — Emergency Department
Admission: EM | Admit: 2017-12-13 | Discharge: 2017-12-13 | Disposition: A | Discharge status: Home / Self Care | Payer: No Typology Code available for payment source | Attending: Emergency Medicine | Admitting: Emergency Medicine

## 2017-12-13 DIAGNOSIS — I10 Essential (primary) hypertension: Secondary | ICD-10-CM | POA: Diagnosis not present

## 2017-12-13 DIAGNOSIS — R0789 Other chest pain: Secondary | ICD-10-CM | POA: Insufficient documentation

## 2017-12-13 DIAGNOSIS — Z794 Long term (current) use of insulin: Secondary | ICD-10-CM | POA: Diagnosis not present

## 2017-12-13 DIAGNOSIS — E109 Type 1 diabetes mellitus without complications: Secondary | ICD-10-CM | POA: Insufficient documentation

## 2017-12-13 DIAGNOSIS — Z79899 Other long term (current) drug therapy: Secondary | ICD-10-CM | POA: Insufficient documentation

## 2017-12-13 DIAGNOSIS — R079 Chest pain, unspecified: Secondary | ICD-10-CM | POA: Diagnosis present

## 2017-12-13 LAB — CBC
HCT: 40.1 % (ref 40.0–52.0)
Hemoglobin: 14 g/dL (ref 13.0–18.0)
MCH: 31.3 pg (ref 26.0–34.0)
MCHC: 34.8 g/dL (ref 32.0–36.0)
MCV: 89.9 fL (ref 80.0–100.0)
PLATELETS: 239 10*3/uL (ref 150–440)
RBC: 4.46 MIL/uL (ref 4.40–5.90)
RDW: 13.1 % (ref 11.5–14.5)
WBC: 5.9 10*3/uL (ref 3.8–10.6)

## 2017-12-13 LAB — BASIC METABOLIC PANEL
Anion gap: 8 (ref 5–15)
BUN: 16 mg/dL (ref 6–20)
CHLORIDE: 101 mmol/L (ref 98–111)
CO2: 25 mmol/L (ref 22–32)
CREATININE: 0.87 mg/dL (ref 0.61–1.24)
Calcium: 9 mg/dL (ref 8.9–10.3)
Glucose, Bld: 267 mg/dL — ABNORMAL HIGH (ref 70–99)
Potassium: 4.1 mmol/L (ref 3.5–5.1)
Sodium: 134 mmol/L — ABNORMAL LOW (ref 135–145)

## 2017-12-13 LAB — TROPONIN I

## 2017-12-13 NOTE — Discharge Instructions (Addendum)
Return to the emergency department for any worsening chest pain, any trouble breathing development of nausea or sweatiness, or any other symptom personally concerning to yourself.  Please call your primary care doctor today or tomorrow to inform them of today's ER visit and to arrange a follow-up appointment within the next several days.

## 2017-12-13 NOTE — Telephone Encounter (Signed)
Noted and agree. Patient currently at the ED.

## 2017-12-13 NOTE — Telephone Encounter (Signed)
He called in while driving to work c/o having a sharp pain when he takes a deep breath.  It hurts above the left nipple into my left armpit all the time but when I take a deep breath it really catches and hurts bad.    You noticed this pain on Sunday and it has become progressively worse.  I'm a type I diabetic for 6 years.   I just want to know I'm ok or if I should see a doctor.   I have referred him to the ED since he is unable to take a deep breath without this sharp pain in his chest that is becoming worse.  He is going to [facility].  I routed a note to Allie Bossier so she would be aware.   Reason for Disposition . Difficulty breathing  Answer Assessment - Initial Assessment Questions 1. LOCATION: "Where does it hurt?"       I've been hurting since over the weekend.   When I breath it really hurts.   I driving to work now.  I have diabetes.    It hurts above left nipple into my left armpit.   If I press there it hurts.   Movement makes it hurt worse.    2. RADIATION: "Does the pain go anywhere else?" (e.g., into neck, jaw, arms, back)     Just under my left armpit.   I'm right handed and this is going on on my left side. 3. ONSET: "When did the chest pain begin?" (Minutes, hours or days)      It started Sunday I noticed it.   It's progessively gotten worse. 4. PATTERN "Does the pain come and go, or has it been constant since it started?"  "Does it get worse with exertion?"      I can't breath deep.   It catches if I deep breath.   It really hurts.   It hurts all the time but worse with a deep breath. 5. DURATION: "How long does it last" (e.g., seconds, minutes, hours)     All the time. 6. SEVERITY: "How bad is the pain?"  (e.g., Scale 1-10; mild, moderate, or severe)    - MILD (1-3): doesn't interfere with normal activities     - MODERATE (4-7): interferes with normal activities or awakens from sleep    - SEVERE (8-10): excruciating pain, unable to do any normal  activities       8-9 now while I'm talking to you and driving.    7. CARDIAC RISK FACTORS: "Do you have any history of heart problems or risk factors for heart disease?" (e.g., prior heart attack, angina; high blood pressure, diabetes, being overweight, high cholesterol, smoking, or strong family history of heart disease)     Diabetes Type I.    I take BP medication.    8. PULMONARY RISK FACTORS: "Do you have any history of lung disease?"  (e.g., blood clots in lung, asthma, emphysema, birth control pills)     No lung problems 9. CAUSE: "What do you think is causing the chest pain?"     Only thing costrocondritis maybe.    10. OTHER SYMPTOMS: "Do you have any other symptoms?" (e.g., dizziness, nausea, vomiting, sweating, fever, difficulty breathing, cough)       Not coughing.  I'm a little dizzy because I can't breath deep.    11. PREGNANCY: "Is there any chance you are pregnant?" "When was your last menstrual period?"  N/A  Protocols used: CHEST PAIN-A-AH

## 2017-12-13 NOTE — ED Triage Notes (Signed)
Pt arrived via POV with reports of chest pain that started on Sunday. Pt reports pain when taking a deep breath.  Pt states the pain radiates from his left chest to the left shoulder and arm pit. Pt states he has had some shortness of breath with the pain.

## 2017-12-13 NOTE — ED Provider Notes (Signed)
[facility] Emergency Department Provider Note  Time seen: 2:17 PM  I have reviewed the triage vital signs and the nursing notes.   HISTORY  Chief Complaint Chest Pain    HPI James Stanley is a 37 y.o. male with a past medical history of anxiety, depression, type 1 diabetes, hypertension, presents to the emergency department for chest pain.  According to the patient for the past 3 days he has been experiencing pain in his left chest.  States 3 days ago he was pushing a soap dispenser when he felt a very sharp pain in the left chest.  States now anytime he moves pushes on the area or takes a deep breath he experiences pain.  Denies any cough or congestion.  No fever.  No lower extremity swelling or tenderness.   Past Medical History:  Diagnosis Date  . Anxiety   . Chickenpox   . Depression   . Diabetes mellitus without complication ([place])   . Frequent headaches   . Hypertension   . Migraines     Patient Active Problem List   Diagnosis Date Noted  . Anxiety and depression 06/14/2016  . Essential hypertension 06/14/2016  . Diabetes mellitus type 1, uncontrolled, insulin dependent ([place]) 06/14/2016  . Hyperlipidemia 06/14/2016    No past surgical history on file.  Prior to Admission medications   Medication Sig Start Date End Date Taking? Authorizing Provider  aspirin-acetaminophen-caffeine (EXCEDRIN MIGRAINE) [phone] MG tablet Take by mouth every 6 (six) hours as needed for headache.    [provider]  atorvastatin (LIPITOR) 40 MG tablet Take 1 tablet (40 mg total) by mouth every evening. 04/13/17   Pleas Koch, NP  Continuous Blood Gluc Receiver (FREESTYLE LIBRE 14 DAY READER) DEVI Use 1 each as needed .  Use to test blood sugar 07/07/17   [provider]  HUMALOG 100 UNIT/ML injection INJECT 0.04-0.06 MLS (4-6 UNITS TOTAL) INTO THE SKIN 3 (THREE) TIMES DAILY WITH MEALS. 06/20/17   Pleas Koch, NP  lisinopril  (PRINIVIL,ZESTRIL) 10 MG tablet Take 1 tablet (10 mg total) by mouth daily. 04/13/17   Pleas Koch, NP  TOUJEO SOLOSTAR 300 UNIT/ML [facility]  07/27/17   [provider]  venlafaxine XR (EFFEXOR XR) 150 MG 24 hr capsule Take 1 capsule (150 mg total) by mouth daily with breakfast. NEED FOLLOW UP APPOINTMENT PLEASE CALL OFFICE 11/13/17   Pleas Koch, NP    No Known Allergies  Family History  Problem Relation Age of Onset  . Hyperlipidemia Mother   . Mental illness Mother   . Hypertension Father   . Diabetes Father   . Mental illness Father   . Hyperlipidemia Maternal Grandmother   . Heart disease Paternal Grandfather   . Hypertension Paternal Grandfather   . Testicular cancer Paternal Grandfather     Social History Social History   Tobacco Use  . Smoking status: Never Smoker  . Smokeless tobacco: Never Used  Substance Use Topics  . Alcohol use: No  . Drug use: Not on file    Review of Systems Constitutional: Negative for fever. ENT: Negative for recent illness/congestion Cardiovascular: Positive for left chest pain, sharp worse with movement or if you push on the area or deep breath or cough. Respiratory: Negative for shortness of breath.  Denies any cough. Gastrointestinal: Negative for abdominal pain, vomiting  Genitourinary: Negative for urinary compaints Musculoskeletal: Negative for leg pain or swelling. Skin: Negative for skin complaints  Neurological: Negative for  headache All other ROS negative  ____________________________________________   PHYSICAL EXAM:  VITAL SIGNS: ED Triage Vitals  Enc Vitals Group     BP 12/13/17 1158 132/83     Pulse Rate 12/13/17 1158 (!) 109     Resp --      Temp 12/13/17 1158 98.5 F (36.9 C)     Temp Source 12/13/17 1158 Oral     SpO2 12/13/17 1158 97 %     Weight 12/13/17 1159 133 lb 2 oz (60.4 kg)     Height 12/13/17 1159 5\' 8"  (1.727 m)     Head Circumference --      Peak Flow --      Pain Score 12/13/17  1208 8     Pain Loc --      Pain Edu? --      Excl. in [place]? --    Constitutional: Alert and oriented. Well appearing and in no distress. Eyes: Normal exam ENT   Head: Normocephalic and atraumatic.   Mouth/Throat: Mucous membranes are moist. Cardiovascular: Normal rate, regular rhythm. No murmur.  Moderate left lateral and left anterior chest tenderness to palpation. Respiratory: Normal respiratory effort without tachypnea nor retractions. Breath sounds are clear Gastrointestinal: Soft and nontender. No distention.   Musculoskeletal: Nontender with normal range of motion in all extremities. No lower extremity tenderness or edema. Neurologic:  Normal speech and language.  Skin:  Skin is warm, dry and intact.  Psychiatric: Mood and affect are normal.   ____________________________________________    EKG  EKG reviewed and interpreted by myself shows sinus tachycardia 110 bpm with a narrow QRS, normal axis, normal intervals, no concerning ST changes.  ____________________________________________    RADIOLOGY  Chest x-ray overall clear.  ____________________________________________   INITIAL IMPRESSION / ASSESSMENT AND PLAN / ED COURSE  Pertinent labs & imaging results that were available during my care of the patient were reviewed by me and considered in my medical decision making (see chart for details).  Patient presents to the emergency department for left-sided chest pain x3 days ever since pushing on a soap dispenser.  Differential would include chest wall pain, muscular skeletal pain, ACS, pneumonia, pneumothorax, rib fracture.  Reassuringly patient is exam shows very reproducible chest pain with palpation.  Symptoms started when he was pushing on a soap dispenser highly suspect musculoskeletal pain.  Patient's labs including cardiac enzymes are reassuring.  Chest x-ray is clear.  Mild tachycardia on EKG however heart rate in the 80s to 90 during my evaluation.  Patient  states mild chest pain if he takes a deep breath more so if he pushes on the area or turns or twists.  Again very suspicious for muscular skeletal pain.  Patient has no leg pain or swelling.  No DVT history.  Does state a family history of cardiac disease but not until their 2s, no history of young cardiac disease.  Overall patient appears very well.  We will discharge with NSAIDs to be used as needed and have the patient follow-up with his primary care doctor.  Patient agreeable with this plan of care.  ____________________________________________   FINAL CLINICAL IMPRESSION(S) / ED DIAGNOSES  Chest wall pain    Harvest Dark, MD 12/13/17 1421

## 2018-02-21 ENCOUNTER — Other Ambulatory Visit: Payer: Self-pay

## 2018-02-21 DIAGNOSIS — F32A Depression, unspecified: Secondary | ICD-10-CM

## 2018-02-21 DIAGNOSIS — E1065 Type 1 diabetes mellitus with hyperglycemia: Secondary | ICD-10-CM

## 2018-02-21 DIAGNOSIS — F419 Anxiety disorder, unspecified: Principal | ICD-10-CM

## 2018-02-21 DIAGNOSIS — F329 Major depressive disorder, single episode, unspecified: Secondary | ICD-10-CM

## 2018-02-21 MED ORDER — VENLAFAXINE HCL ER 150 MG PO CP24: 150.0000 mg | ORAL_CAPSULE | Freq: Every day | ORAL | 0 refills | Status: DC

## 2018-02-21 NOTE — Telephone Encounter (Signed)
Send Rx request to endocrinologist.

## 2018-02-21 NOTE — Telephone Encounter (Signed)
Last prescribed on 06/20/2017 Last office visit on 08/07/2017

## 2018-02-26 ENCOUNTER — Ambulatory Visit: Payer: No Typology Code available for payment source | Admitting: Primary Care

## 2018-02-26 DIAGNOSIS — Z0289 Encounter for other administrative examinations: Secondary | ICD-10-CM

## 2018-04-16 ENCOUNTER — Other Ambulatory Visit: Payer: Self-pay | Admitting: Primary Care

## 2018-04-16 DIAGNOSIS — F32A Depression, unspecified: Secondary | ICD-10-CM

## 2018-04-16 DIAGNOSIS — F329 Major depressive disorder, single episode, unspecified: Secondary | ICD-10-CM

## 2018-04-16 DIAGNOSIS — F419 Anxiety disorder, unspecified: Principal | ICD-10-CM

## 2018-05-15 ENCOUNTER — Other Ambulatory Visit: Payer: Self-pay | Admitting: Primary Care

## 2018-05-15 DIAGNOSIS — F419 Anxiety disorder, unspecified: Principal | ICD-10-CM

## 2018-05-15 DIAGNOSIS — F329 Major depressive disorder, single episode, unspecified: Secondary | ICD-10-CM

## 2018-05-15 DIAGNOSIS — F32A Depression, unspecified: Secondary | ICD-10-CM

## 2018-07-02 NOTE — Telephone Encounter (Signed)
James Stanley, please get him on my schedule for any day this week.  Thanks!

## 2018-07-04 ENCOUNTER — Encounter: Payer: PRIVATE HEALTH INSURANCE | Admitting: Primary Care

## 2018-07-09 NOTE — Telephone Encounter (Signed)
Appointment has been made for 07/10/2018

## 2018-07-09 NOTE — Telephone Encounter (Signed)
Message left for patient to return my call.  

## 2018-07-09 NOTE — Telephone Encounter (Signed)
James Stanley, please call patient and get him scheduled for an office visit for March 31st.

## 2018-07-10 ENCOUNTER — Encounter: Payer: Self-pay | Admitting: Primary Care

## 2018-07-10 ENCOUNTER — Other Ambulatory Visit: Payer: Self-pay

## 2018-07-10 ENCOUNTER — Other Ambulatory Visit: Payer: Self-pay | Admitting: Primary Care

## 2018-07-10 ENCOUNTER — Ambulatory Visit: Payer: PRIVATE HEALTH INSURANCE | Admitting: Primary Care

## 2018-07-10 VITALS — BP 124/80 | HR 89 | Temp 98.2°F | Ht 68.5 in | Wt 136.2 lb

## 2018-07-10 DIAGNOSIS — E1065 Type 1 diabetes mellitus with hyperglycemia: Secondary | ICD-10-CM | POA: Diagnosis not present

## 2018-07-10 DIAGNOSIS — F32A Depression, unspecified: Secondary | ICD-10-CM

## 2018-07-10 DIAGNOSIS — IMO0002 Reserved for concepts with insufficient information to code with codable children: Secondary | ICD-10-CM

## 2018-07-10 DIAGNOSIS — F419 Anxiety disorder, unspecified: Secondary | ICD-10-CM | POA: Diagnosis not present

## 2018-07-10 DIAGNOSIS — E785 Hyperlipidemia, unspecified: Secondary | ICD-10-CM | POA: Diagnosis not present

## 2018-07-10 DIAGNOSIS — I10 Essential (primary) hypertension: Secondary | ICD-10-CM

## 2018-07-10 DIAGNOSIS — F329 Major depressive disorder, single episode, unspecified: Secondary | ICD-10-CM

## 2018-07-10 LAB — COMPREHENSIVE METABOLIC PANEL
ALT: 6 U/L (ref 0–53)
AST: 14 U/L (ref 0–37)
Albumin: 4.3 g/dL (ref 3.5–5.2)
Alkaline Phosphatase: 72 U/L (ref 39–117)
BILIRUBIN TOTAL: 0.3 mg/dL (ref 0.2–1.2)
BUN: 9 mg/dL (ref 6–23)
CALCIUM: 9.4 mg/dL (ref 8.4–10.5)
CO2: 29 meq/L (ref 19–32)
Chloride: 102 mEq/L (ref 96–112)
Creatinine, Ser: 0.83 mg/dL (ref 0.40–1.50)
GFR: 104.52 mL/min (ref >60.00)
GLUCOSE: 207 mg/dL — AB (ref 70–99)
Potassium: 4.3 mEq/L (ref 3.5–5.1)
Sodium: 137 mEq/L (ref 135–145)
Total Protein: 6.2 g/dL (ref 6.0–8.3)

## 2018-07-10 LAB — LIPID PANEL
CHOL/HDL RATIO: 3
Cholesterol: 223 mg/dL — ABNORMAL HIGH (ref 0–200)
HDL: 78.8 mg/dL (ref >39.00)
LDL Cholesterol: 130 mg/dL — ABNORMAL HIGH (ref 0–99)
NONHDL: 144.25
TRIGLYCERIDES: 73 mg/dL (ref 0.0–149.0)
VLDL: 14.6 mg/dL (ref 0.0–40.0)

## 2018-07-10 LAB — HEMOGLOBIN A1C: HEMOGLOBIN A1C: 10.8 % — AB (ref 4.6–6.5)

## 2018-07-10 MED ORDER — FREESTYLE LIBRE 14 DAY SENSOR MISC: 1.0000 | Freq: Every day | 5 refills | Status: DC

## 2018-07-10 MED ORDER — LISINOPRIL 2.5 MG PO TABS: 2.5000 mg | ORAL_TABLET | Freq: Every day | ORAL | 3 refills | Status: DC

## 2018-07-10 MED ORDER — ATORVASTATIN CALCIUM 40 MG PO TABS: 40.0000 mg | ORAL_TABLET | Freq: Every evening | ORAL | 3 refills | Status: DC

## 2018-07-10 MED ORDER — FREESTYLE LIBRE 14 DAY READER DEVI: 1.0000 | Freq: Every day | 0 refills | Status: DC

## 2018-07-10 MED ORDER — INSULIN LISPRO 100 UNIT/ML ~~LOC~~ SOLN: SUBCUTANEOUS | 1 refills | Status: DC

## 2018-07-10 NOTE — Progress Notes (Signed)
Subjective:    Patient ID: RANDI COLLEGE, male    DOB: 04-07-1981, 38 y.o.   MRN: [id number]  HPI  Mr. Ekstein is a 38 year old male who presents today for follow up of his chronic conditions. He has not been seen in our office since April 2019.  1) Type 1 Diabetes:   Currently not following with endocrinology despite being referred in Spring 2019. He attended one visit with endocrinology in late March 2019, was recommended to take 1 unit of humalog for every 15 g of carb, add 1 unit for readings that are 50 over 125, complete sliding scale before meals and half dose of sliding scale at bedtime. He was also referred by endocrinology to see the diabetes educator at [facility]. It was recommended he return three months later for which he never returned.  Since his last visit he's compliant to insulin and is injecting 4-5 units three times daily with meals on average, also uses sliding scale. He agrees to go to endocrinology.   He's checking his glucose 5 times daily and is getting readings of: AM fasting: mid to high 200's Before lunch: 140-150's 1 hour after lunch: high 100's Before dinner: 140's-150's  2) Anxiety and Depression: Last evaluated in person on 08/07/2017 for symptoms of lightheadedness, feeling nervous at work, tearfulness. GAD 7 score of 12 and PHQ 9 score of 13 on venlafaxine XR 75 mg so we increased his dose of venlafaxine to 150 mg daily. He was asked to return to the office for follow up 4-6 weeks later, he never returned.  Since his last visit he's doing better on the venlafaxine at the 150 mg dose. He would like to be referred to therapy as he has some unresolved family issues he'd like to discuss. He denies SI/HI.  3) Essential Hypertension: Currently prescribed lisinopril 10 mg. He's been out of his lisinopril for 6+ months. He's been checking his BP over the last several weeks with readings of 120's/70-80's. He denies dizziness, chest pain, shortness of breath. He's  increased his activity since working from home and is walking several times daily.  BP Readings from Last 3 Encounters:  07/10/18 124/80  12/13/17 114/79  11/30/17 126/84   4) Hyperlipidemia: Currently prescribed atorvastatin 40 mg. His last lipid panel was in August 2019 with LDL of 61. He's been out of his atorvastatin for the last 6+ months.   Review of Systems  Eyes: Negative for visual disturbance.  Respiratory: Negative for shortness of breath.   Cardiovascular: Negative for chest pain.  Neurological: Negative for dizziness, weakness, numbness and headaches.  Psychiatric/Behavioral: Negative for suicidal ideas.       See HPI       Past Medical History:  Diagnosis Date   Anxiety    Chickenpox    Depression    Diabetes mellitus without complication ([place])    Frequent headaches    Hypertension    Migraines      Social History   Socioeconomic History   Marital status: Single    Spouse name: Not on file   Number of children: Not on file   Years of education: Not on file   Highest education level: Not on file  Occupational History   Not on file  Social Needs   Financial resource strain: Not on file   Food insecurity:    Worry: Not on file    Inability: Not on file   Transportation needs:    Medical: Not on  file    Non-medical: Not on file  Tobacco Use   Smoking status: Never Smoker   Smokeless tobacco: Never Used  Substance and Sexual Activity   Alcohol use: No   Drug use: Not on file   Sexual activity: Not on file  Lifestyle   Physical activity:    Days per week: Not on file    Minutes per session: Not on file   Stress: Not on file  Relationships   Social connections:    Talks on phone: Not on file    Gets together: Not on file    Attends religious service: Not on file    Active member of club or organization: Not on file    Attends meetings of clubs or organizations: Not on file    Relationship status: Not on file   Intimate  partner violence:    Fear of current or ex partner: Not on file    Emotionally abused: Not on file    Physically abused: Not on file    Forced sexual activity: Not on file  Other Topics Concern   Not on file  Social History Narrative   Single.   Works at [employer].   Moved from [place].   Enjoys reading.    No past surgical history on file.  Family History  Problem Relation Age of Onset   Hyperlipidemia Mother    Mental illness Mother    Hypertension Father    Diabetes Father    Mental illness Father    Hyperlipidemia Maternal Grandmother    Heart disease Paternal Grandfather    Hypertension Paternal Grandfather    Testicular cancer Paternal Grandfather     No Known Allergies  Current Outpatient Medications on File Prior to Visit  Medication Sig Dispense Refill   aspirin-acetaminophen-caffeine (EXCEDRIN MIGRAINE) 250-250-65 MG tablet Take by mouth every 6 (six) hours as needed for headache.     atorvastatin (LIPITOR) 40 MG tablet Take 1 tablet (40 mg total) by mouth every evening. 90 tablet 3   venlafaxine XR (EFFEXOR-XR) 150 MG 24 hr capsule Take 1 capsule (150 mg total) by mouth daily with breakfast. WILL BE DUE FOR A FOLLOW UP APPOINTMENT 90 capsule 0   No current facility-administered medications on file prior to visit.     BP 124/80    Pulse 89    Temp 98.2 F (36.8 C) (Oral)    Ht 5' 8.5" (1.74 m)    Wt 136 lb 4 oz (61.8 kg)    SpO2 98%    BMI 20.42 kg/m    Objective:   Physical Exam  Constitutional: He appears well-nourished.  Neck: Neck supple.  Cardiovascular: Normal rate and regular rhythm.  Respiratory: Effort normal and breath sounds normal.  Skin: Skin is warm and dry.  Psychiatric: He has a normal mood and affect.           Assessment & Plan:

## 2018-07-10 NOTE — Assessment & Plan Note (Signed)
Repeat lipid panel pending. Will re-initiate statin therapy once lipid panel returns.

## 2018-07-10 NOTE — Assessment & Plan Note (Signed)
Repeat A1C pending today. Glucose readings overall seem decent, he is higher in the morning.  We will have him continue his current regimen for now, referral placed to endocrinology for further management. He agrees.  Managed on ACE. Will re-initiate statin therapy once lipid panel returns.

## 2018-07-10 NOTE — Patient Instructions (Addendum)
Stop by the lab prior to leaving today. I will notify you of your results once received.   Continue your Humalog as prescribed.  We've reduced the dose of your lisinopril to 2.5 mg daily for kidney protection against diabetes.  I will send refills of your atorvastatin once I receive your lab results.  You will be contacted regarding your referral to endocrinology and therapy.  Please let us know if you have not been contacted within one week.   Continue venlafaxine ER 150 mg daily for anxiety and depression.  It was a pleasure to see you today!

## 2018-07-10 NOTE — Assessment & Plan Note (Signed)
Doing well on venlafaxine ER 150 mg, continue same. Referral placed to therapy for further evaluation.  Denies SI/HI.

## 2018-07-10 NOTE — Assessment & Plan Note (Signed)
Stable and at goal off of lisinopril 10 mg. Will reduce dose to 2.5 mg in order to provide renal protection from uncontrolled diabetes. New Rx sent to pharmacy.

## 2018-07-24 ENCOUNTER — Other Ambulatory Visit: Payer: Self-pay | Admitting: Primary Care

## 2018-07-24 DIAGNOSIS — E1065 Type 1 diabetes mellitus with hyperglycemia: Secondary | ICD-10-CM

## 2018-07-25 MED ORDER — INSULIN LISPRO 100 UNIT/ML ~~LOC~~ SOLN: SUBCUTANEOUS | 1 refills | Status: DC

## 2018-07-25 NOTE — Addendum Note (Signed)
Addended by: Jacqualin Combes on: 07/25/2018 08:10 AM   Modules accepted: Orders

## 2018-07-28 ENCOUNTER — Other Ambulatory Visit: Payer: Self-pay

## 2018-07-28 DIAGNOSIS — F32A Depression, unspecified: Secondary | ICD-10-CM

## 2018-07-28 DIAGNOSIS — F329 Major depressive disorder, single episode, unspecified: Secondary | ICD-10-CM

## 2018-07-28 DIAGNOSIS — F419 Anxiety disorder, unspecified: Principal | ICD-10-CM

## 2018-07-30 MED ORDER — VENLAFAXINE HCL ER 150 MG PO CP24: 150.0000 mg | ORAL_CAPSULE | Freq: Every day | ORAL | 3 refills | Status: DC

## 2018-08-23 DIAGNOSIS — E1065 Type 1 diabetes mellitus with hyperglycemia: Secondary | ICD-10-CM

## 2018-09-25 ENCOUNTER — Encounter: Payer: Self-pay | Admitting: Internal Medicine

## 2018-10-15 ENCOUNTER — Telehealth: Payer: Self-pay

## 2018-10-15 NOTE — Telephone Encounter (Signed)
Noted. Did he get set up with endocrinology? If not then will need a diabetes follow up with me now.

## 2018-10-15 NOTE — Telephone Encounter (Signed)
Spoke to patient.  He was seen in urgent care on Saturday and tested for COVID.  He is at home now under quarantine and symptoms finally taking a turn for the better today.  He ran a high fever with nausea, vomiting and diarrhea through yesterday pm.  Today is the first day he has been able to keep food down.   He has been quarantined to his room as he lives with his dad who is high risk.    He thanks [place] for the call and will call and let us know the results of his COVID test when it comes in in the next day or 2.  He knows to call us if his symptoms do not continue to improve and he will go to ER if develops any severe breathing difficulty.   He did not have any respiratory symptoms, mostly GI and urgent care docs think likely just a GI bug but will know for sure when results come back.  FYI to K. Carlis Abbott, NP.

## 2018-10-15 NOTE — Telephone Encounter (Signed)
[facility] Night - Client TELEPHONE ADVICE RECORD [facility] Patient Name: James Stanley Gender: Male DOB: 17-May-1980 Age: 38 Y 9 M 14 D Return Phone Number: [phone] (Primary), [phone] (Secondary) [place] [address] Client [facility] Night - Client Client Site [place] - Night Physician Alma Friendly - NP Contact Type Call Who Is Calling Patient / Member / Family / Caregiver Call Type Triage / Clinical Relationship To Patient Self Return Phone Number [phone] (Primary) Chief Complaint BREATHING - shortness of breath or sounds breathless Reason for Call Symptomatic / Request for [place] states he started feeling bad at the end of the day. He has a temp of 100.7, diarrhea and shortness and breath. He is coughing and vomiting. Muscle aches. [place] Not Listed virtual care appointment phone number provided for sat UC clinic Translation No Nurse Assessment Nurse: Jerene Bears, RN, Will Date/Time (Eastern Time): 10/13/2018 6:59:40 AM Confirm and document reason for call. If symptomatic, describe symptoms. ---Caller reports that he had onset of body aches, fever/chills, vomiting/diarrhea, mild shortness of breath. Caller speaking in full, clear sentences. Current temperature of 99 forehead. Has the patient had close contact with a person known or suspected to have the novel coronavirus illness OR traveled / lives in area with major community spread (including international travel) in the last 14 days from the onset of symptoms? * If Asymptomatic, screen for exposure and travel within the last 14 days. ---No Does the patient have any new or worsening symptoms? ---Yes Will a triage be completed? ---Yes Related visit to physician within the last 2 weeks? ---No Does the PT have any chronic conditions? (i.e. diabetes, asthma, this  includes High risk factors for pregnancy, etc.) ---Yes List chronic conditions. ---dm I, htn, Is this a behavioral health or substance abuse call? ---No Guidelines Guideline Title Affirmed Question Affirmed Notes Nurse Date/Time (Eastern Time) Coronavirus (COVID-19) - Diagnosed or Suspected [1] COVID-19 diagnosed by positive lab test AND Jerene Bears, RN, Will 10/13/2018 7:02:45 AM PLEASE NOTE: All timestamps contained within this report are represented as [place] Standard Time. CONFIDENTIALTY NOTICE: This fax transmission is intended only for the addressee. It contains information that is legally privileged, confidential or otherwise protected from use or disclosure. If you are not the intended recipient, you are strictly prohibited from reviewing, disclosing, copying using or disseminating any of this information or taking any action in reliance on or regarding this information. If you have received this fax in error, please notify us immediately by telephone so that we can arrange for its return to [place]. Phone: [phone], Toll-Free: [phone], Fax: [phone] Page: 2 of 2 Call Id: [id number] Guidelines Guideline Title Affirmed Question Affirmed Notes Nurse Date/Time Eilene Ghazi Time) [2] mild symptoms (e.g., cough, fever, others) AND [5] no complications or SOB Vomiting High-risk adult (e.g., diabetes mellitus, brain tumor, V-P shunt, hernia) Jerene Bears, RN, Will 10/13/2018 7:07:43 AM Disp. Time Eilene Ghazi Time) Disposition Final User 10/13/2018 6:56:15 AM Send to Urgent Queue Artis Flock 10/13/2018 7:06:01 [place], RN, Will 10/13/2018 7:13:10 AM Go to ED Now (or PCP triage) Yes Jerene Bears, RN, Will Caller Disagree/Comply Comply Caller Understands Yes PreDisposition Call Doctor Care Advice Given Per Guideline HOME CARE: CALL BACK IF: * Fever over 103 F (39.4 C) * Chest pain or difficulty breathing occurs * You become worse. CARE ADVICE given per CORONAVIRUS (COVID-19) -  DIAGNOSED OR SUSPECTED (Adult) guideline. * IF NO PCP (PRIMARY CARE PROVIDER) SECOND-LEVEL  TRIAGE: You need to be seen within the next hour. Go to the [facility] at _____________ [place] as soon as you can. CARE ADVICE per Vomiting (Adult) guideline. Referrals GO TO FACILITY OTHER - SPECIFY

## 2018-10-16 NOTE — Telephone Encounter (Addendum)
Spoken to patient. No, he did not because he was waiting for the pandemic to die down. I did ask him to schedule an appointment (virtual)  but he is very concern and wanted to wait until he get his COVID result back first. He stated that he will call as soon as he is clear.

## 2018-10-16 NOTE — Telephone Encounter (Signed)
Noted  

## 2018-11-16 DIAGNOSIS — E1065 Type 1 diabetes mellitus with hyperglycemia: Secondary | ICD-10-CM

## 2018-11-16 MED ORDER — INSULIN LISPRO 100 UNIT/ML ~~LOC~~ SOLN: SUBCUTANEOUS | 0 refills | Status: DC

## 2018-11-16 MED ORDER — FREESTYLE LIBRE 14 DAY SENSOR MISC: 1.0000 | Freq: Every day | 0 refills | Status: DC

## 2019-02-04 ENCOUNTER — Ambulatory Visit: Payer: PRIVATE HEALTH INSURANCE | Admitting: Primary Care

## 2019-02-12 ENCOUNTER — Other Ambulatory Visit: Payer: Self-pay | Admitting: Primary Care

## 2019-02-12 DIAGNOSIS — E1065 Type 1 diabetes mellitus with hyperglycemia: Secondary | ICD-10-CM

## 2019-03-21 ENCOUNTER — Other Ambulatory Visit: Payer: Self-pay

## 2019-03-21 ENCOUNTER — Ambulatory Visit: Payer: PRIVATE HEALTH INSURANCE | Admitting: Primary Care

## 2019-03-21 ENCOUNTER — Encounter: Payer: Self-pay | Admitting: Primary Care

## 2019-03-21 ENCOUNTER — Other Ambulatory Visit: Payer: Self-pay | Admitting: Primary Care

## 2019-03-21 VITALS — BP 122/80 | HR 108 | Temp 97.1°F | Ht 68.5 in | Wt 132.5 lb

## 2019-03-21 DIAGNOSIS — I10 Essential (primary) hypertension: Secondary | ICD-10-CM | POA: Diagnosis not present

## 2019-03-21 DIAGNOSIS — Z7251 High risk heterosexual behavior: Secondary | ICD-10-CM

## 2019-03-21 DIAGNOSIS — IMO0002 Reserved for concepts with insufficient information to code with codable children: Secondary | ICD-10-CM

## 2019-03-21 DIAGNOSIS — E1065 Type 1 diabetes mellitus with hyperglycemia: Secondary | ICD-10-CM | POA: Diagnosis not present

## 2019-03-21 DIAGNOSIS — F419 Anxiety disorder, unspecified: Secondary | ICD-10-CM

## 2019-03-21 DIAGNOSIS — E785 Hyperlipidemia, unspecified: Secondary | ICD-10-CM

## 2019-03-21 DIAGNOSIS — F32A Depression, unspecified: Secondary | ICD-10-CM

## 2019-03-21 DIAGNOSIS — F329 Major depressive disorder, single episode, unspecified: Secondary | ICD-10-CM

## 2019-03-21 LAB — COMPREHENSIVE METABOLIC PANEL
ALT: 9 U/L (ref 0–53)
AST: 13 U/L (ref 0–37)
Albumin: 4.4 g/dL (ref 3.5–5.2)
Alkaline Phosphatase: 91 U/L (ref 39–117)
BUN: 12 mg/dL (ref 6–23)
CO2: 29 mEq/L (ref 19–32)
Calcium: 8.9 mg/dL (ref 8.4–10.5)
Chloride: 101 mEq/L (ref 96–112)
Creatinine, Ser: 0.78 mg/dL (ref 0.40–1.50)
GFR: 111.86 mL/min (ref >60.00)
Glucose, Bld: 207 mg/dL — ABNORMAL HIGH (ref 70–99)
Potassium: 4.2 mEq/L (ref 3.5–5.1)
Sodium: 137 mEq/L (ref 135–145)
Total Bilirubin: 0.5 mg/dL (ref 0.2–1.2)
Total Protein: 6.8 g/dL (ref 6.0–8.3)

## 2019-03-21 LAB — LIPID PANEL
Cholesterol: 168 mg/dL (ref 0–200)
HDL: 78.7 mg/dL (ref >39.00)
LDL Cholesterol: 82 mg/dL (ref 0–99)
NonHDL: 89.65
Total CHOL/HDL Ratio: 2
Triglycerides: 39 mg/dL (ref 0.0–149.0)
VLDL: 7.8 mg/dL (ref 0.0–40.0)

## 2019-03-21 LAB — HEMOGLOBIN A1C: Hgb A1c MFr Bld: 9 % — ABNORMAL HIGH (ref 4.6–6.5)

## 2019-03-21 MED ORDER — LANTUS SOLOSTAR 100 UNIT/ML ~~LOC~~ SOPN: 10.0000 [IU] | PEN_INJECTOR | Freq: Every day | SUBCUTANEOUS | 0 refills | Status: DC

## 2019-03-21 MED ORDER — PEN NEEDLES 31G X 6 MM MISC: 0 refills | Status: DC

## 2019-03-21 NOTE — Assessment & Plan Note (Signed)
Compliant to atorvastatin, repeat lipids pending. 

## 2019-03-21 NOTE — Assessment & Plan Note (Signed)
Repeat A1C pending. Compliant to Humalog, will initiate Lantus 10 units HS given continued elevated glucose readings.  Referral placed to endocrinology.

## 2019-03-21 NOTE — Assessment & Plan Note (Signed)
Doing well on venlafaxine, continue same.  

## 2019-03-21 NOTE — Assessment & Plan Note (Signed)
Stable in the office today, continue low dose lisinopril.

## 2019-03-21 NOTE — Assessment & Plan Note (Signed)
Referral placed to infectious disease.

## 2019-03-21 NOTE — Patient Instructions (Signed)
Stop by the lab prior to leaving today. I will notify you of your results once received.   You will be contacted regarding your referral to endocrinology and infectious disease.  Please let us know if you have not been contacted within two weeks.   It was a pleasure to see you today!

## 2019-03-21 NOTE — Progress Notes (Signed)
Subjective:    Patient ID: James Stanley, male    DOB: 1980-05-04, 38 y.o.   MRN: [id number]  HPI  James Stanley is a 38 year old male with a history of type 1 diabetes, hypertension, hyperlipidemia who presents today for follow up.  1) Type 1 Diabetes:  Currently managed on Humalog and is injecting 5 units TID with meals, lisinopril 2.5 mg for renal protection. He is not on long acting insulin due to cost but is now able to afford.   He's checking blood sugars constantly as he has the free style Libre. His fasting morning numbers are 250's. Post prandial readings are around mid 200's. His last A1C was 10.8 in March 2020.   He is requesting a referral to endocrinology, numerous referrals have been made in the past for which he has not followed through. He is ready to go now.  2) Hyperlipidemia: Currently managed on atorvastatin 40 mg. Lipids last checked in March 2020 with LDL of 130, had not been on atorvastatin at the time. He is now compliant daily.  BP Readings from Last 3 Encounters:  03/21/19 122/80  07/10/18 124/80  12/13/17 114/79   3) PREP: Patient in a monogamous relationship with his male partner, wears protection. He is interested in starting Prep for HIV prevention.   Review of Systems  Respiratory: Negative for shortness of breath.   Cardiovascular: Negative for chest pain.  Neurological: Negative for dizziness.  Psychiatric/Behavioral:       Doing well on venlafaxine.       Past Medical History:  Diagnosis Date  . Anxiety   . Chickenpox   . Depression   . Diabetes mellitus without complication ([place])   . Frequent headaches   . Hypertension   . Migraines      Social History   Socioeconomic History  . Marital status: Single    Spouse name: Not on file  . Number of children: Not on file  . Years of education: Not on file  . Highest education level: Not on file  Occupational History  . Not on file  Tobacco Use  . Smoking status: Never Smoker  .  Smokeless tobacco: Never Used  Substance and Sexual Activity  . Alcohol use: No  . Drug use: Not on file  . Sexual activity: Not on file  Other Topics Concern  . Not on file  Social History Narrative   Single.   Works at [employer].   Moved from [place].   Enjoys reading.   Social Determinants of Health   Financial Resource Strain:   . Difficulty of Paying Living Expenses: Not on file  Food Insecurity:   . Worried About Charity fundraiser in the Last Year: Not on file  . Ran Out of Food in the Last Year: Not on file  Transportation Needs:   . Lack of Transportation (Medical): Not on file  . Lack of Transportation (Non-Medical): Not on file  Physical Activity:   . Days of Exercise per Week: Not on file  . Minutes of Exercise per Session: Not on file  Stress:   . Feeling of Stress : Not on file  Social Connections:   . Frequency of Communication with Friends and Family: Not on file  . Frequency of Social Gatherings with Friends and Family: Not on file  . Attends Religious Services: Not on file  . Active Member of Clubs or Organizations: Not on file  . Attends Archivist Meetings: Not on  file  . Marital Status: Not on file  Intimate Partner Violence:   . Fear of Current or Ex-Partner: Not on file  . Emotionally Abused: Not on file  . Physically Abused: Not on file  . Sexually Abused: Not on file    No past surgical history on file.  Family History  Problem Relation Age of Onset  . Hyperlipidemia Mother   . Mental illness Mother   . Hypertension Father   . Diabetes Father   . Mental illness Father   . Hyperlipidemia Maternal Grandmother   . Heart disease Paternal Grandfather   . Hypertension Paternal Grandfather   . Testicular cancer Paternal Grandfather     No Known Allergies  Current Outpatient Medications on File Prior to Visit  Medication Sig Dispense Refill  . aspirin-acetaminophen-caffeine (EXCEDRIN MIGRAINE) 250-250-65 MG tablet Take by  mouth every 6 (six) hours as needed for headache.    Marland Kitchen atorvastatin (LIPITOR) 40 MG tablet Take 1 tablet (40 mg total) by mouth every evening. For cholesterol. 90 tablet 3  . Continuous Blood Gluc Receiver (FREESTYLE LIBRE 14 DAY READER) DEVI Inject 1 Device into the skin daily. 1 Device 0  . Continuous Blood Gluc Sensor (FREESTYLE LIBRE 14 DAY SENSOR) MISC USE DAILY AS DIRECTED TO CHECK BLOOD SUGARS 2 each 1  . insulin lispro (HUMALOG) 100 UNIT/ML injection INJECT 4-6 UNITS TOTAL INTO THE SKIN 3 (THREE) TIMES DAILY WITH MEALS. 30 mL 0  . lisinopril (PRINIVIL,ZESTRIL) 2.5 MG tablet Take 1 tablet (2.5 mg total) by mouth daily. For kidney protection. 90 tablet 3  . venlafaxine XR (EFFEXOR-XR) 150 MG 24 hr capsule Take 1 capsule (150 mg total) by mouth daily with breakfast. 90 capsule 3   No current facility-administered medications on file prior to visit.    BP 122/80   Pulse (!) 108   Temp (!) 97.1 F (36.2 C) (Temporal)   Ht 5' 8.5" (1.74 m)   Wt 132 lb 8 oz (60.1 kg)   SpO2 98%   BMI 19.85 kg/m    Objective:   Physical Exam  Constitutional: He appears well-nourished.  Cardiovascular: Normal rate and regular rhythm.  Respiratory: Effort normal and breath sounds normal.  Musculoskeletal:     Cervical back: Neck supple.  Skin: Skin is warm and dry.  Psychiatric: He has a normal mood and affect.           Assessment & Plan:

## 2019-03-25 DIAGNOSIS — IMO0002 Reserved for concepts with insufficient information to code with codable children: Secondary | ICD-10-CM

## 2019-03-26 ENCOUNTER — Other Ambulatory Visit: Payer: Self-pay | Admitting: Primary Care

## 2019-03-26 DIAGNOSIS — E1065 Type 1 diabetes mellitus with hyperglycemia: Secondary | ICD-10-CM

## 2019-03-26 MED ORDER — BASAGLAR KWIKPEN 100 UNIT/ML ~~LOC~~ SOPN: 10.0000 [IU] | PEN_INJECTOR | Freq: Every day | SUBCUTANEOUS | 1 refills | Status: DC

## 2019-03-26 MED ORDER — INSULIN DETEMIR 100 UNIT/ML FLEXPEN: 10.0000 [IU] | PEN_INJECTOR | Freq: Every day | SUBCUTANEOUS | 1 refills | Status: DC

## 2019-04-16 ENCOUNTER — Other Ambulatory Visit: Payer: Self-pay | Admitting: Primary Care

## 2019-04-16 DIAGNOSIS — E1065 Type 1 diabetes mellitus with hyperglycemia: Secondary | ICD-10-CM

## 2019-04-16 NOTE — Telephone Encounter (Signed)
Pt called checking on rx.  He stated his expires tonight

## 2019-04-16 NOTE — Telephone Encounter (Signed)
Refill sent to pharmacy.   

## 2019-04-24 ENCOUNTER — Other Ambulatory Visit: Payer: Self-pay | Admitting: Pharmacist

## 2019-04-24 DIAGNOSIS — Z7251 High risk heterosexual behavior: Secondary | ICD-10-CM

## 2019-04-24 NOTE — Progress Notes (Signed)
Ordering PrEP labs

## 2019-04-25 ENCOUNTER — Telehealth: Payer: Self-pay | Admitting: Pharmacy Technician

## 2019-04-25 ENCOUNTER — Other Ambulatory Visit (HOSPITAL_COMMUNITY)
Admission: RE | Admit: 2019-04-25 | Discharge: 2019-04-25 | Disposition: A | Discharge status: Home / Self Care | Payer: PRIVATE HEALTH INSURANCE | Source: Ambulatory Visit | Attending: Infectious Disease | Admitting: Infectious Disease

## 2019-04-25 ENCOUNTER — Ambulatory Visit: Payer: PRIVATE HEALTH INSURANCE | Admitting: Pharmacist

## 2019-04-25 DIAGNOSIS — Z7251 High risk heterosexual behavior: Secondary | ICD-10-CM

## 2019-04-25 NOTE — Telephone Encounter (Addendum)
[facility] Patient Teacher, English as a foreign language completed.    The patient is insured through [facility].  When running a test claim for both Descovy and Truvada, his insurance states NDC not covered for specialty medications.  The insurance group confirmed that generic Truvada, not brand will process.   Venida Jarvis. Nadara Mustard Kersey Patient [facility] Phone: [phone] Fax:  [phone]

## 2019-04-26 LAB — URINE CYTOLOGY ANCILLARY ONLY
Chlamydia: NEGATIVE
Comment: NEGATIVE
Comment: NORMAL
Neisseria Gonorrhea: NEGATIVE

## 2019-04-26 LAB — BASIC METABOLIC PANEL
BUN: 11 mg/dL (ref 7–25)
CO2: 31 mmol/L (ref 20–32)
Calcium: 9.3 mg/dL (ref 8.6–10.3)
Chloride: 102 mmol/L (ref 98–110)
Creat: 0.78 mg/dL (ref 0.60–1.35)
Glucose, Bld: 60 mg/dL — ABNORMAL LOW (ref 65–99)
Potassium: 3.8 mmol/L (ref 3.5–5.3)
Sodium: 140 mmol/L (ref 135–146)

## 2019-04-26 LAB — HEPATITIS B SURFACE ANTIBODY,QUALITATIVE: Hep B S Ab: NONREACTIVE

## 2019-04-26 LAB — SYPHILIS: RPR W/REFLEX TO RPR TITER AND TREPONEMAL ANTIBODIES, TRADITIONAL SCREENING AND DIAGNOSIS ALGORITHM: RPR Ser Ql: NONREACTIVE

## 2019-04-26 LAB — HEPATITIS C ANTIBODY
Hepatitis C Ab: NONREACTIVE
SIGNAL TO CUT-OFF: 0

## 2019-04-26 LAB — HEPATITIS A ANTIBODY, TOTAL: Hepatitis A AB,Total: REACTIVE — AB

## 2019-04-26 LAB — HIV ANTIBODY (ROUTINE TESTING W REFLEX): HIV 1&2 Ab, 4th Generation: NONREACTIVE

## 2019-04-26 LAB — HEPATITIS B SURFACE ANTIGEN: Hepatitis B Surface Ag: NONREACTIVE

## 2019-05-02 ENCOUNTER — Ambulatory Visit: Payer: PRIVATE HEALTH INSURANCE | Admitting: Primary Care

## 2019-05-03 ENCOUNTER — Ambulatory Visit: Payer: PRIVATE HEALTH INSURANCE | Admitting: Primary Care

## 2019-05-03 ENCOUNTER — Encounter: Payer: Self-pay | Admitting: Primary Care

## 2019-05-03 ENCOUNTER — Other Ambulatory Visit: Payer: Self-pay

## 2019-05-03 DIAGNOSIS — R238 Other skin changes: Secondary | ICD-10-CM | POA: Insufficient documentation

## 2019-05-03 NOTE — Assessment & Plan Note (Signed)
Noted to groin, unclear etiology. Doesn't appear infectious. Discussed to wear cotton underwear to prevent excess moisture, loose fitting clothing.  Return precautions provided.

## 2019-05-03 NOTE — Progress Notes (Signed)
Subjective:    Patient ID: James Stanley, male    DOB: 06/10/1980, 39 y.o.   MRN: [id number]  HPI  This visit occurred during the SARS-CoV-2 public health emergency.  Safety protocols were in place, including screening questions prior to the visit, additional usage of staff PPE, and extensive cleaning of exam room while observing appropriate contact time as indicated for disinfecting solutions.   Mr. James Stanley is a 39 year old male with a history of type 1 diabetes, hypertension, hyperlipidemia who presents today with a chief complaint of skin irritation.   He was evaluated at Urgent Care in [facility] in November 2020 for painful skin irritation with itching to the right posterior buttocks. He was itching the site a lot so he thinks he created an infection. He was treated with Doxycycline 100 BID for a 7 day course and noticed improvement with now near resolve.   Three days ago he noticed a new spot to the left groin which began as bumps that have since reduced. He's noticed improvement today compared to yesterday. He denies fevers, drainage, surrounding erythema. He's not applied anything topically. He's recently established with infectious disease clinic and tested negative for HIV.  Review of Systems  Constitutional: Negative for fever.  Skin:       Skin irritation        Past Medical History:  Diagnosis Date  . Anxiety   . Chickenpox   . Depression   . Diabetes mellitus without complication ([place])   . Frequent headaches   . Hypertension   . Migraines      Social History   Socioeconomic History  . Marital status: Single    Spouse name: Not on file  . Number of children: Not on file  . Years of education: Not on file  . Highest education level: Not on file  Occupational History  . Not on file  Tobacco Use  . Smoking status: Never Smoker  . Smokeless tobacco: Never Used  Substance and Sexual Activity  . Alcohol use: No  . Drug use: Not on file  . Sexual activity:  Not on file  Other Topics Concern  . Not on file  Social History Narrative   Single.   Works at [employer].   Moved from [place].   Enjoys reading.   Social Determinants of Health   Financial Resource Strain:   . Difficulty of Paying Living Expenses: Not on file  Food Insecurity:   . Worried About Charity fundraiser in the Last Year: Not on file  . Ran Out of Food in the Last Year: Not on file  Transportation Needs:   . Lack of Transportation (Medical): Not on file  . Lack of Transportation (Non-Medical): Not on file  Physical Activity:   . Days of Exercise per Week: Not on file  . Minutes of Exercise per Session: Not on file  Stress:   . Feeling of Stress : Not on file  Social Connections:   . Frequency of Communication with Friends and Family: Not on file  . Frequency of Social Gatherings with Friends and Family: Not on file  . Attends Religious Services: Not on file  . Active Member of Clubs or Organizations: Not on file  . Attends Archivist Meetings: Not on file  . Marital Status: Not on file  Intimate Partner Violence:   . Fear of Current or Ex-Partner: Not on file  . Emotionally Abused: Not on file  . Physically Abused: Not  on file  . Sexually Abused: Not on file    No past surgical history on file.  Family History  Problem Relation Age of Onset  . Hyperlipidemia Mother   . Mental illness Mother   . Hypertension Father   . Diabetes Father   . Mental illness Father   . Hyperlipidemia Maternal Grandmother   . Heart disease Paternal Grandfather   . Hypertension Paternal Grandfather   . Testicular cancer Paternal Grandfather     No Known Allergies  Current Outpatient Medications on File Prior to Visit  Medication Sig Dispense Refill  . aspirin-acetaminophen-caffeine (EXCEDRIN MIGRAINE) 250-250-65 MG tablet Take by mouth every 6 (six) hours as needed for headache.    Marland Kitchen atorvastatin (LIPITOR) 40 MG tablet Take 1 tablet (40 mg total) by mouth  every evening. For cholesterol. 90 tablet 3  . Continuous Blood Gluc Receiver (FREESTYLE LIBRE 14 DAY READER) DEVI Inject 1 Device into the skin daily. 1 Device 0  . Continuous Blood Gluc Sensor (FREESTYLE LIBRE 14 DAY SENSOR) MISC USE DAILY AS DIRECTED TO CHECK BLOOD SUGARS 2 each 1  . Insulin Detemir (LEVEMIR) 100 UNIT/ML Pen Inject 10 Units into the skin at bedtime. 15 mL 1  . insulin lispro (HUMALOG) 100 UNIT/ML injection INJECT 4-6 UNITS TOTAL INTO THE SKIN 3 (THREE) TIMES DAILY WITH MEALS. 10 mL 2  . Insulin Pen Needle (B-D UF III MINI PEN NEEDLES) 31G X 5 MM MISC Use nightly with insulin 100 each 0  . lisinopril (PRINIVIL,ZESTRIL) 2.5 MG tablet Take 1 tablet (2.5 mg total) by mouth daily. For kidney protection. 90 tablet 3  . venlafaxine XR (EFFEXOR-XR) 150 MG 24 hr capsule Take 1 capsule (150 mg total) by mouth daily with breakfast. 90 capsule 3   No current facility-administered medications on file prior to visit.    BP 122/80   Pulse 92   Temp (!) 97.1 F (36.2 C) (Temporal)   Ht 5' 4.5" (1.638 m)   Wt 131 lb 8 oz (59.6 kg)   SpO2 98%   BMI 22.22 kg/m    Objective:   Physical Exam  Constitutional: He appears well-nourished.  Respiratory: Effort normal.  Skin: Skin is warm and dry.  1 cm rounded area of erythema with scabbing. No firmness under the site, no surrounding erythema. Appears to be healing.            Assessment & Plan:

## 2019-05-03 NOTE — Patient Instructions (Signed)
Monitor the site for firmness underneath, surrounding redness, drainage, fevers.  It was a pleasure to see you today!

## 2019-05-10 ENCOUNTER — Telehealth: Payer: Self-pay | Admitting: Pharmacy Technician

## 2019-05-10 NOTE — Telephone Encounter (Addendum)
[facility] Patient Advocate Encounter   Patient has been approved for [employer] Advancing Access Patient Assistance Program for [place] from 04/29/2019 to 04/28/2020. This assistance will make the patient's copay $0.  The billing information is as follows.  Member ID: [id number] [place]: [id number] PCN: [id number] Group: [id number]  Fax arrived 05/02/19, patient shows active [facility] insurance and generic Truvada covered at $0. Brand Descovy and Truvada will require a drug exclusion override in the rxb portal and PrEP coverage requires authorization for Teva voucher to cover cost.   Bartholomew Crews, [facility] for Infectious Disease Phone: [phone] Fax: [phone] 05/10/2019 9:35 AM

## 2019-05-13 ENCOUNTER — Telehealth: Payer: Self-pay | Admitting: Pharmacist

## 2019-05-13 DIAGNOSIS — Z7251 High risk heterosexual behavior: Secondary | ICD-10-CM

## 2019-05-13 MED ORDER — DESCOVY 200-25 MG PO TABS: 1.0000 | ORAL_TABLET | Freq: Every day | ORAL | 2 refills | Status: DC

## 2019-05-13 MED FILL — DESCOVY 200-25 MG TABS: 200-25 | 30 days supply | Qty: 30 | Fill #0

## 2019-05-13 NOTE — Telephone Encounter (Signed)
Called patient to discuss PrEP.  He came in for lab work one afternoon that I was unavailable. He is HIV negative and wishes to start PrEP.  He has one partner who is HIV positive and undetectable. Explained PrEP and our process here. Will send Descovy to [facility], and they will mail it to his house. I will f/u with him in April.

## 2019-05-15 DIAGNOSIS — E1065 Type 1 diabetes mellitus with hyperglycemia: Secondary | ICD-10-CM

## 2019-05-15 MED ORDER — FREESTYLE LIBRE 14 DAY SENSOR MISC: 1 refills | Status: DC

## 2019-06-12 MED FILL — DESCOVY 200-25 MG TABS: 200-25 | 30 days supply | Qty: 30 | Fill #1

## 2019-06-20 ENCOUNTER — Other Ambulatory Visit: Payer: Self-pay

## 2019-06-20 ENCOUNTER — Ambulatory Visit (INDEPENDENT_AMBULATORY_CARE_PROVIDER_SITE_OTHER): Payer: PRIVATE HEALTH INSURANCE | Admitting: Primary Care

## 2019-06-20 DIAGNOSIS — N529 Male erectile dysfunction, unspecified: Secondary | ICD-10-CM | POA: Insufficient documentation

## 2019-06-20 NOTE — Assessment & Plan Note (Signed)
Difficulty maintaining erection at times. Weight is well within normal range, diet is healthy. This could be secondary to improved blood glucose levels. Check testosterone and Thyroid function. He will also mention this to his endocrinologist.

## 2019-06-20 NOTE — Progress Notes (Signed)
Subjective:    Patient ID: James Stanley, male    DOB: 31-Oct-1980, 39 y.o.   MRN: [id number]  HPI  Virtual Visit via Video Note  I connected with James Stanley on 06/20/19 at  8:40 AM EST by a video enabled telemedicine application and verified that I am speaking with the correct person using two identifiers.  Location: Patient: Home Provider: Office   I discussed the limitations of evaluation and management by telemedicine and the availability of in person appointments. The patient expressed understanding and agreed to proceed.  History of Present Illness:  James Stanley is a 39 year old male with a history of hypertension, type 1 diabetes, anxiety and depression, hyperlipidemia, high risk sexual behavior managed on Descovy who presents today with a chief complaint of   Over the last 3-4 months he's finding difficulty in maintaining an erection. This does occur each time he has intercourse, maybe about 40% of the time. Has no difficulty obtaining an erection.   He denies dysuria, penile swelling, penile discharge, hematuria. He denies increased stress. He has better control over his blood sugars since establishing with endocrinology. He's not sure if his erection difficulty is due to more normalizing blood sugars as he's been high for so long.   He endorses a healthy diet, maintains a healthy weight. Denies anxiety/increased stress.   Observations/Objective:  Alert and oriented. Appears well, not sickly. No distress. Speaking in complete sentences.  Assessment and Plan:  Difficulty maintaining erection at times. Weight is well within normal range, diet is healthy. This could be secondary to improved blood glucose levels. Check testosterone and Thyroid function. He will also mention this to his endocrinologist.   Follow Up Instructions:  Call the main line to set up a lab appointment prior to 10 am as discussed.  It was a pleasure to see you today! James Bossier,  NP-C    I discussed the assessment and treatment plan with the patient. The patient was provided an opportunity to ask questions and all were answered. The patient agreed with the plan and demonstrated an understanding of the instructions.   The patient was advised to call back or seek an in-person evaluation if the symptoms worsen or if the condition fails to improve as anticipated.     James Koch, NP    Review of Systems  Genitourinary: Negative for discharge, dysuria, hematuria, penile pain and penile swelling.       Difficulty maintaining erection   Psychiatric/Behavioral: The patient is not nervous/anxious.        Past Medical History:  Diagnosis Date  . Anxiety   . Chickenpox   . Depression   . Diabetes mellitus without complication ([place])   . Frequent headaches   . Hypertension   . Migraines      Social History   Socioeconomic History  . Marital status: Single    Spouse name: Not on file  . Number of children: Not on file  . Years of education: Not on file  . Highest education level: Not on file  Occupational History  . Not on file  Tobacco Use  . Smoking status: Never Smoker  . Smokeless tobacco: Never Used  Substance and Sexual Activity  . Alcohol use: No  . Drug use: Not on file  . Sexual activity: Not on file  Other Topics Concern  . Not on file  Social History Narrative   Single.   Works at [employer].   Moved from [place].  Enjoys reading.   Social Determinants of Health   Financial Resource Strain:   . Difficulty of Paying Living Expenses:   Food Insecurity:   . Worried About Charity fundraiser in the Last Year:   . Arboriculturist in the Last Year:   Transportation Needs:   . Film/video editor (Medical):   Marland Kitchen Lack of Transportation (Non-Medical):   Physical Activity:   . Days of Exercise per Week:   . Minutes of Exercise per Session:   Stress:   . Feeling of Stress :   Social Connections:   . Frequency of  Communication with Friends and Family:   . Frequency of Social Gatherings with Friends and Family:   . Attends Religious Services:   . Active Member of Clubs or Organizations:   . Attends Archivist Meetings:   Marland Kitchen Marital Status:   Intimate Partner Violence:   . Fear of Current or Ex-Partner:   . Emotionally Abused:   Marland Kitchen Physically Abused:   . Sexually Abused:     No past surgical history on file.  Family History  Problem Relation Age of Onset  . Hyperlipidemia Mother   . Mental illness Mother   . Hypertension Father   . Diabetes Father   . Mental illness Father   . Hyperlipidemia Maternal Grandmother   . Heart disease Paternal Grandfather   . Hypertension Paternal Grandfather   . Testicular cancer Paternal Grandfather     No Known Allergies  Current Outpatient Medications on File Prior to Visit  Medication Sig Dispense Refill  . aspirin-acetaminophen-caffeine (EXCEDRIN MIGRAINE) 250-250-65 MG tablet Take by mouth every 6 (six) hours as needed for headache.    Marland Kitchen atorvastatin (LIPITOR) 40 MG tablet Take 1 tablet (40 mg total) by mouth every evening. For cholesterol. 90 tablet 3  . Continuous Blood Gluc Receiver (FREESTYLE LIBRE 14 DAY READER) DEVI Inject 1 Device into the skin daily. 1 Device 0  . Continuous Blood Gluc Sensor (FREESTYLE LIBRE 14 DAY SENSOR) MISC USE DAILY AS DIRECTED TO CHECK BLOOD SUGARS 2 each 1  . emtricitabine-tenofovir AF (DESCOVY) 200-25 MG tablet Take 1 tablet by mouth daily. 30 tablet 2  . Insulin Detemir (LEVEMIR) 100 UNIT/ML Pen Inject 10 Units into the skin at bedtime. 15 mL 1  . insulin lispro (HUMALOG) 100 UNIT/ML injection INJECT 4-6 UNITS TOTAL INTO THE SKIN 3 (THREE) TIMES DAILY WITH MEALS. 10 mL 2  . Insulin Pen Needle (B-D UF III MINI PEN NEEDLES) 31G X 5 MM MISC Use nightly with insulin 100 each 0  . lisinopril (PRINIVIL,ZESTRIL) 2.5 MG tablet Take 1 tablet (2.5 mg total) by mouth daily. For kidney protection. 90 tablet 3  .  venlafaxine XR (EFFEXOR-XR) 150 MG 24 hr capsule Take 1 capsule (150 mg total) by mouth daily with breakfast. 90 capsule 3   No current facility-administered medications on file prior to visit.    There were no vitals taken for this visit.   Objective:   Physical Exam  Constitutional: He is oriented to person, place, and time. He appears well-nourished.  Respiratory: Effort normal.  Neurological: He is alert and oriented to person, place, and time.  Psychiatric: He has a normal mood and affect.           Assessment & Plan:

## 2019-06-20 NOTE — Patient Instructions (Signed)
Call the main line to set up a lab appointment prior to 10 am as discussed.  It was a pleasure to see you today! Allie Bossier, NP-C

## 2019-07-02 ENCOUNTER — Other Ambulatory Visit: Payer: Self-pay | Admitting: Primary Care

## 2019-07-02 DIAGNOSIS — E1065 Type 1 diabetes mellitus with hyperglycemia: Secondary | ICD-10-CM

## 2019-07-02 DIAGNOSIS — F419 Anxiety disorder, unspecified: Secondary | ICD-10-CM

## 2019-07-02 DIAGNOSIS — F329 Major depressive disorder, single episode, unspecified: Secondary | ICD-10-CM

## 2019-07-02 DIAGNOSIS — F32A Depression, unspecified: Secondary | ICD-10-CM

## 2019-07-16 MED FILL — DESCOVY 200-25 MG TABS: 200-25 | 30 days supply | Qty: 30 | Fill #2

## 2019-07-23 ENCOUNTER — Ambulatory Visit: Payer: PRIVATE HEALTH INSURANCE | Admitting: Pharmacist

## 2019-07-29 ENCOUNTER — Other Ambulatory Visit: Payer: Self-pay | Admitting: Primary Care

## 2019-07-29 DIAGNOSIS — E1065 Type 1 diabetes mellitus with hyperglycemia: Secondary | ICD-10-CM

## 2019-07-29 DIAGNOSIS — E785 Hyperlipidemia, unspecified: Secondary | ICD-10-CM

## 2019-08-08 ENCOUNTER — Other Ambulatory Visit: Payer: Self-pay

## 2019-08-08 ENCOUNTER — Encounter: Payer: Self-pay | Admitting: Registered Nurse

## 2019-08-08 ENCOUNTER — Ambulatory Visit: Payer: Self-pay | Admitting: Registered Nurse

## 2019-08-08 VITALS — BP 130/96 | HR 94 | Temp 96.4°F

## 2019-08-08 DIAGNOSIS — L03317 Cellulitis of buttock: Secondary | ICD-10-CM

## 2019-08-08 DIAGNOSIS — R030 Elevated blood-pressure reading, without diagnosis of hypertension: Secondary | ICD-10-CM

## 2019-08-08 MED ORDER — DOXYCYCLINE HYCLATE 100 MG PO TABS: 100.0000 mg | ORAL_TABLET | Freq: Two times a day (BID) | ORAL | 0 refills | Status: DC

## 2019-08-08 MED ORDER — MUPIROCIN 2 % EX OINT: 1.0000 "application " | TOPICAL_OINTMENT | Freq: Two times a day (BID) | CUTANEOUS | 0 refills | Status: DC

## 2019-08-08 NOTE — Progress Notes (Signed)
Subjective:    Patient ID: James Stanley, male    DOB: 05-29-1980, 39 y.o.   MRN: [id number]  38y/o Caucasian established male pt reporting a boil/asbcess that has been recurrent for past 5 months. Located on R buttock. Has been treated with abx Doxycycline from UC and Teledoc twice. Will reduce in size but never fully clears.   Has been applying neosporin and trying not to scratch.  Diabetic pt, sugars averaging 220 per meter Freestyle Libre.  Denied drainage, fever, chills, n/v/d      Review of Systems  Constitutional: Negative for activity change, appetite change, chills, diaphoresis, fatigue, fever and unexpected weight change.  HENT: Negative for trouble swallowing and voice change.   Eyes: Negative for photophobia and visual disturbance.  Respiratory: Negative for cough, shortness of breath, wheezing and stridor.   Cardiovascular: Negative for chest pain.  Gastrointestinal: Negative for diarrhea, nausea and vomiting.  Endocrine: Negative for cold intolerance and heat intolerance.  Genitourinary: Negative for difficulty urinating.  Musculoskeletal: Positive for myalgias. Negative for arthralgias, back pain, gait problem, joint swelling, neck pain and neck stiffness.  Skin: Positive for color change and rash. Negative for pallor and wound.  Allergic/Immunologic: Positive for immunocompromised state. Negative for environmental allergies and food allergies.  Neurological: Negative for dizziness, tremors, seizures, syncope, facial asymmetry, speech difficulty, weakness, light-headedness, numbness and headaches.  Hematological: Negative for adenopathy. Does not bruise/bleed easily.  Psychiatric/Behavioral: Negative for agitation, confusion and sleep disturbance.       Objective:   Physical Exam Vitals and nursing note reviewed.  Constitutional:      General: He is awake. He is not in acute distress.    Appearance: Normal appearance. He is well-developed, well-groomed and normal  weight. He is not ill-appearing, toxic-appearing or diaphoretic.  HENT:     Head: Normocephalic and atraumatic.     Jaw: There is normal jaw occlusion.     Salivary Glands: Right salivary gland is not diffusely enlarged or tender. Left salivary gland is not diffusely enlarged or tender.     Right Ear: Hearing and external ear normal.     Left Ear: Hearing and external ear normal.     Nose: Nose normal.     Mouth/Throat:     Lips: Pink. No lesions.     Mouth: Mucous membranes are moist. No angioedema.     Pharynx: Oropharynx is clear.  Eyes:     General: Lids are normal. Vision grossly intact. Gaze aligned appropriately. No allergic shiner, visual field deficit or scleral icterus.       Right eye: No discharge.        Left eye: No discharge.     Extraocular Movements: Extraocular movements intact.     Conjunctiva/sclera: Conjunctivae normal.     Pupils: Pupils are equal, round, and reactive to light.  Neck:     Trachea: Trachea normal.  Cardiovascular:     Rate and Rhythm: Normal rate and regular rhythm.     Pulses: Normal pulses.          Radial pulses are 2+ on the right side and 2+ on the left side.     Heart sounds: Normal heart sounds. No murmur. No friction rub. No gallop.   Pulmonary:     Effort: Pulmonary effort is normal. No respiratory distress.     Breath sounds: Normal breath sounds and air entry. No stridor, decreased air movement or transmitted upper airway sounds. No decreased breath sounds, wheezing, rhonchi or rales.  Comments: Wearing cloth mask due to covid 19 pandemic; frequently adjusting; spoke full sentences without diffiuclty; no cough observed in exam room Abdominal:     General: Abdomen is flat.     Palpations: Abdomen is soft.  Musculoskeletal:        General: Swelling and tenderness present. No deformity or signs of injury. Normal range of motion.     Right shoulder: Normal.     Left shoulder: Normal.     Right elbow: Normal.     Left elbow: Normal.      Right hand: Normal.     Left hand: Normal.     Cervical back: Normal, normal range of motion and neck supple. No swelling, edema, deformity, erythema, signs of trauma, lacerations, rigidity, torticollis or crepitus. No pain with movement. Normal range of motion.     Thoracic back: Normal.     Lumbar back: Normal.     Right hip: Normal.     Left hip: Normal.     Right knee: Normal.     Left knee: Normal.     Right lower leg: No edema.     Left lower leg: No edema.       Legs:  Lymphadenopathy:     Head:     Right side of head: No submental, submandibular or preauricular adenopathy.     Left side of head: No submental, submandibular or preauricular adenopathy.     Cervical: No cervical adenopathy.     Right cervical: No superficial cervical adenopathy.    Left cervical: No superficial cervical adenopathy.  Skin:    General: Skin is warm and dry.     Capillary Refill: Capillary refill takes less than 2 seconds.     Coloration: Skin is not ashen, cyanotic, jaundiced, mottled, pale or sallow.     Findings: Erythema and rash present. No abrasion, abscess, acne, bruising, burn, ecchymosis, signs of injury, laceration, lesion, petechiae or wound. Rash is macular and papular. Rash is not crusting, nodular, purpuric, pustular, scaling, urticarial or vesicular.     Nails: There is no clubbing.          Comments: Grouped papules on erythematous base centrally macular erythema no fluctuance slightly TTP ointment and bandaid removed for evaluation papules 0.5cm diameter grouping; induration and slightly TTP extends another 0.5cm diameter  Neurological:     General: No focal deficit present.     Mental Status: He is alert and oriented to person, place, and time. Mental status is at baseline.     GCS: GCS eye subscore is 4. GCS verbal subscore is 5. GCS motor subscore is 6.     Cranial Nerves: Cranial nerves are intact. No cranial nerve deficit, dysarthria or facial asymmetry.     Sensory:  Sensation is intact. No sensory deficit.     Motor: Motor function is intact. No weakness, tremor, atrophy, abnormal muscle tone or seizure activity.     Coordination: Coordination is intact. Coordination normal.     Gait: Gait is intact. Gait normal.     Comments: Gait sure and steady in hallway; in/out of clinic without difficulty; in/out of chair without difficulty; bilateral hand grasp equal 5/5  Psychiatric:        Attention and Perception: Attention and perception normal.        Mood and Affect: Mood and affect normal.        Speech: Speech normal.        Behavior: Behavior normal. Behavior is cooperative.  Thought Content: Thought content normal.        Cognition and Memory: Cognition and memory normal.        Judgment: Judgment normal.           Assessment & Plan:  A-cellulitis buttock initial visit, elevated blood pressure  P-Exitcare handout on skin infection and abscess printed and given to patient. Discussed possible side effects wear sunscreen/sun protective clothing.   Doxycycline 100mg  po BID x 7 days and will re-evaluate in one may need to take up to 14 days #20 RF0 dispensed from [facility] to patient. Electronic Rx to his pharmacy of choice start bactroban 2% topical BID after washing affected area with soap and water.  May apply warm compress daily to help bring circulation to area.  Do not puncture affected area or scratch to get to drain if drains on own let it but do not dig/use needle/scratch as could worsen/spready infection.  RTC if worsening erythema, pain, purulent discharge, fever after 48 hours on antibiotics.  May spread worsen a little over the next 24 hours to be expected but after that should be improving. Wash towels, washcloths, sheets in hot water with bleach every couple of days until infection resolved. Wash area with soap and water at least daily.  Apply bactroban daily to BID with dressing change.  Cover with bandage until healed over.   Patient  verbalized understanding, agreed with plan of care and had no further questions at this time.   Will decrease caffeine intake Discussed ER if chest pain, worst headache of life, dyspnea or visual changes for re-evaluation.  Will see RN Hildred Alamin tomorrow for [facility] labs and before caffeine intake repeat BP check. Patient verbalized understanding information/instructions, agreed with plan of care and had no further questions at this time.

## 2019-08-08 NOTE — Patient Instructions (Signed)
Skin Abscess  A skin abscess is an infected area on or under your skin that contains a collection of pus and other material. An abscess may also be called a furuncle, carbuncle, or boil. An abscess can occur in or on almost any part of your body. Some abscesses break open (rupture) on their own. Most continue to get worse unless they are treated. The infection can spread deeper into the body and eventually into your blood, which can make you feel ill. Treatment usually involves draining the abscess. What are the causes? An abscess occurs when germs, like bacteria, pass through your skin and cause an infection. This may be caused by:  A scrape or cut on your skin.  A puncture wound through your skin, including a needle injection or insect bite.  Blocked oil or sweat glands.  Blocked and infected hair follicles.  A cyst that forms beneath your skin (sebaceous cyst) and becomes infected. What increases the risk? This condition is more likely to develop in people who:  Have a weak body defense system (immune system).  Have diabetes.  Have dry and irritated skin.  Get frequent injections or use illegal IV drugs.  Have a foreign body in a wound, such as a splinter.  Have problems with their lymph system or veins. What are the signs or symptoms? Symptoms of this condition include:  A painful, firm bump under the skin.  A bump with pus at the top. This may break through the skin and drain. Other symptoms include:  Redness surrounding the abscess site.  Warmth.  Swelling of the lymph nodes (glands) near the abscess.  Tenderness.  A sore on the skin. How is this diagnosed? This condition may be diagnosed based on:  A physical exam.  Your medical history.  A sample of pus. This may be used to find out what is causing the infection.  Blood tests.  Imaging tests, such as an ultrasound, CT scan, or MRI. How is this treated? A small abscess that drains on its own may not  need treatment. Treatment for larger abscesses may include:  Moist heat or heat pack applied to the area several times a day.  A procedure to drain the abscess (incision and drainage).  Antibiotic medicines. For a severe abscess, you may first get antibiotics through an IV and then change to antibiotics by mouth. Follow these instructions at home: Medicines   Take over-the-counter and prescription medicines only as told by your health care provider.  If you were prescribed an antibiotic medicine, take it as told by your health care provider. Do not stop taking the antibiotic even if you start to feel better. Abscess care   If you have an abscess that has not drained, apply heat to the affected area. Use the heat source that your health care provider recommends, such as a moist heat pack or a heating pad. ? Place a towel between your skin and the heat source. ? Leave the heat on for 20-30 minutes. ? Remove the heat if your skin turns bright red. This is especially important if you are unable to feel pain, heat, or cold. You may have a greater risk of getting burned.  Follow instructions from your health care provider about how to take care of your abscess. Make sure you: ? Cover the abscess with a bandage (dressing). ? Change your dressing or gauze as told by your health care provider. ? Wash your hands with soap and water before you change the   dressing or gauze. If soap and water are not available, use hand sanitizer.  Check your abscess every day for signs of a worsening infection. Check for: ? More redness, swelling, or pain. ? More fluid or blood. ? Warmth. ? More pus or a bad smell. General instructions  To avoid spreading the infection: ? Do not share personal care items, towels, or hot tubs with others. ? Avoid making skin contact with other people.  Keep all follow-up visits as told by your health care provider. This is important. Contact a health care provider if you  have:  More redness, swelling, or pain around your abscess.  More fluid or blood coming from your abscess.  Warm skin around your abscess.  More pus or a bad smell coming from your abscess.  A fever.  Muscle aches.  Chills or a general ill feeling. Get help right away if you:  Have severe pain.  See red streaks on your skin spreading away from the abscess. Summary  A skin abscess is an infected area on or under your skin that contains a collection of pus and other material.  A small abscess that drains on its own may not need treatment.  Treatment for larger abscesses may include having a procedure to drain the abscess and taking an antibiotic. This information is not intended to replace advice given to you by your health care provider. Make sure you discuss any questions you have with your health care provider. Document Revised: 07/19/2018 Document Reviewed: 05/11/2017 Elsevier Patient Education  [place]. Cellulitis, Adult  Cellulitis is a skin infection. The infected area is usually warm, red, swollen, and tender. This condition occurs most often in the arms and lower legs. The infection can travel to the muscles, blood, and underlying tissue and become serious. It is very important to get treated for this condition. What are the causes? Cellulitis is caused by bacteria. The bacteria enter through a break in the skin, such as a cut, burn, insect bite, open sore, or crack. What increases the risk? This condition is more likely to occur in people who:  Have a weak body defense system (immune system).  Have open wounds on the skin, such as cuts, burns, bites, and scrapes. Bacteria can enter the body through these open wounds.  Are older than 38 years of age.  Have diabetes.  Have a type of long-lasting (chronic) liver disease (cirrhosis) or kidney disease.  Are obese.  Have a skin condition such as: ? Itchy rash (eczema). ? Slow movement of blood in the  veins (venous stasis). ? Fluid buildup below the skin (edema).  Have had radiation therapy.  Use IV drugs. What are the signs or symptoms? Symptoms of this condition include:  Redness, streaking, or spotting on the skin.  Swollen area of the skin.  Tenderness or pain when an area of the skin is touched.  Warm skin.  A fever.  Chills.  Blisters. How is this diagnosed? This condition is diagnosed based on a medical history and physical exam. You may also have tests, including:  Blood tests.  Imaging tests. How is this treated? Treatment for this condition may include:  Medicines, such as antibiotic medicines or medicines to treat allergies (antihistamines).  Supportive care, such as rest and application of cold or warm cloths (compresses) to the skin.  Hospital care, if the condition is severe. The infection usually starts to get better within 1-2 days of treatment. Follow these instructions at home:  Medicines  Take over-the-counter and prescription medicines only as told by your health care provider.  If you were prescribed an antibiotic medicine, take it as told by your health care provider. Do not stop taking the antibiotic even if you start to feel better. General instructions  Drink enough fluid to keep your urine pale yellow.  Do not touch or rub the infected area.  Raise (elevate) the infected area above the level of your heart while you are sitting or lying down.  Apply warm or cold compresses to the affected area as told by your health care provider.  Keep all follow-up visits as told by your health care provider. This is important. These visits let your health care provider make sure a more serious infection is not developing. Contact a health care provider if:  You have a fever.  Your symptoms do not begin to improve within 1-2 days of starting treatment.  Your bone or joint underneath the infected area becomes painful after the skin has  healed.  Your infection returns in the same area or another area.  You notice a swollen bump in the infected area.  You develop new symptoms.  You have a general ill feeling (malaise) with muscle aches and pains. Get help right away if:  Your symptoms get worse.  You feel very sleepy.  You develop vomiting or diarrhea that persists.  You notice red streaks coming from the infected area.  Your red area gets larger or turns dark in color. These symptoms may represent a serious problem that is an emergency. Do not wait to see if the symptoms will go away. Get medical help right away. Call your local emergency services (911 in the [place] Do not drive yourself to the hospital. Summary  Cellulitis is a skin infection. This condition occurs most often in the arms and lower legs.  Treatment for this condition may include medicines, such as antibiotic medicines or antihistamines.  Take over-the-counter and prescription medicines only as told by your health care provider. If you were prescribed an antibiotic medicine, do not stop taking the antibiotic even if you start to feel better.  Contact a health care provider if your symptoms do not begin to improve within 1-2 days of starting treatment or your symptoms get worse.  Keep all follow-up visits as told by your health care provider. This is important. These visits let your health care provider make sure that a more serious infection is not developing. This information is not intended to replace advice given to you by your health care provider. Make sure you discuss any questions you have with your health care provider. Document Revised: 08/17/2017 Document Reviewed: 08/17/2017 Elsevier Patient Education  [place].

## 2019-08-19 ENCOUNTER — Telehealth: Payer: Self-pay | Admitting: *Deleted

## 2019-08-19 NOTE — Telephone Encounter (Addendum)
Pt reports improvement in size of abscess to R buttock since starting Doxycycline BID on 4/29. Reports size and pain to area is reduced. Still itchy and with macular rash.  Antibiotics extended per NP Tina's OV notes. Dispensed additional bottle for pt of Doxycycline 100mg  po BID x4 days to complete 14 day course #20 RF0 from [facility] stock as he ran out of original 10 day supply yesterday.   Labs scheduled 5/13 in clinic. Will re-evaluate site at that time.

## 2019-08-19 NOTE — Telephone Encounter (Signed)
Noted agreed with plan of care

## 2019-08-30 NOTE — Telephone Encounter (Signed)
Did rash completely resolve?

## 2019-08-30 NOTE — Telephone Encounter (Signed)
Pt failed to show for 5/13 labs. Reached out to pt yesterday by email prior to his scheduled shift requesting he present this morning for labs. No response received back yet. 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 Will f/u again next week.

## 2019-08-30 NOTE — Telephone Encounter (Signed)
Noted still pending rescheduling labs

## 2019-09-05 NOTE — Telephone Encounter (Signed)
Have labs been scheduled?

## 2019-09-10 NOTE — Telephone Encounter (Signed)
Pt has no-showed for multiple lab appts to date. No longer has direct phone extension line within company due to change in position and dept. No response to company emails. 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 Will follow up again with another email and check warehouse today for pt.

## 2019-09-10 NOTE — Telephone Encounter (Signed)
noted 

## 2019-09-19 NOTE — Telephone Encounter (Signed)
Patient seen in parking lot today reminded to schedule lab appt with RN Hildred Alamin.  Patient verbalized understanding information/instructions and had no further questions at this time.

## 2019-10-03 NOTE — Telephone Encounter (Signed)
Patient has not scheduled follow up lab draw despite multiple reminders from [employer] and myself.

## 2019-10-04 NOTE — Telephone Encounter (Signed)
Unable to contact pt. Appt for fasting labs sent to his personal and work email addresses for Tuesday 6/29 at 0900.

## 2019-10-06 ENCOUNTER — Other Ambulatory Visit: Payer: Self-pay | Admitting: Primary Care

## 2019-10-06 DIAGNOSIS — E1065 Type 1 diabetes mellitus with hyperglycemia: Secondary | ICD-10-CM

## 2019-10-18 NOTE — Telephone Encounter (Signed)
Yes, and no response to phone call or email to reschedule later that week.

## 2019-10-18 NOTE — Telephone Encounter (Signed)
Did patient no show lab appt again?

## 2019-10-18 NOTE — Telephone Encounter (Signed)
Noted patient no showed RN lab draw appt and no response to RN attempts to reschedule.

## 2019-10-24 NOTE — Telephone Encounter (Signed)
Please try to reschedule patient labs

## 2019-11-07 ENCOUNTER — Other Ambulatory Visit: Payer: Self-pay

## 2019-11-07 ENCOUNTER — Encounter: Payer: Self-pay | Admitting: Registered Nurse

## 2019-11-07 ENCOUNTER — Ambulatory Visit: Payer: Self-pay | Admitting: Registered Nurse

## 2019-11-07 VITALS — BP 136/80 | HR 95 | Temp 96.8°F

## 2019-11-07 DIAGNOSIS — L03314 Cellulitis of groin: Secondary | ICD-10-CM

## 2019-11-07 DIAGNOSIS — IMO0002 Reserved for concepts with insufficient information to code with codable children: Secondary | ICD-10-CM

## 2019-11-07 DIAGNOSIS — R030 Elevated blood-pressure reading, without diagnosis of hypertension: Secondary | ICD-10-CM

## 2019-11-07 MED ORDER — DOXYCYCLINE HYCLATE 100 MG PO TABS: 100.0000 mg | ORAL_TABLET | Freq: Two times a day (BID) | ORAL | 0 refills | Status: AC

## 2019-11-07 NOTE — Patient Instructions (Signed)

## 2019-11-07 NOTE — Progress Notes (Signed)
Subjective:    Patient ID: James Stanley, male    DOB: [birth year], 39 y.o.   MRN: [id number]  38y/o Caucasian established male pt c/o recurrent abscess to L inner upper thigh. He noticed it 2-3 days ago. Reports it is swollen and tender. No drainage or open wound that he has seen. Keeping mupirocin and bandage over area. Patient working new job in [employer] and sweating more.  Wondering if due to clothes rubbing or diabetes that he is getting these recurrent skin infections or a problem with cleaning bathroom/shower.  States his blood sugars have been better 140-160s, new position at work keeping him very active but knows sugar levels would improve more if he had insulin pump.  Stated his dad had same problem.     Review of Systems  Constitutional: Negative for activity change, appetite change, chills, diaphoresis, fatigue and fever.  HENT: Negative for trouble swallowing and voice change.   Eyes: Negative for photophobia and visual disturbance.  Respiratory: Negative for cough, shortness of breath, wheezing and stridor.   Cardiovascular: Negative for palpitations and leg swelling.  Gastrointestinal: Negative for diarrhea, nausea and vomiting.  Endocrine: Negative for cold intolerance and heat intolerance.  Genitourinary: Negative for difficulty urinating.  Musculoskeletal: Positive for myalgias. Negative for arthralgias, back pain, gait problem, joint swelling, neck pain and neck stiffness.  Skin: Positive for color change and rash. Negative for pallor and wound.  Allergic/Immunologic: Positive for immunocompromised state. Negative for food allergies.  Neurological: Negative for dizziness, tremors, seizures, syncope, facial asymmetry, speech difficulty, weakness, light-headedness, numbness and headaches.  Hematological: Negative for adenopathy. Does not bruise/bleed easily.  Psychiatric/Behavioral: Negative for agitation, confusion and sleep disturbance.       Objective:   Physical  Exam Vitals and nursing note reviewed.  Constitutional:      General: He is awake. He is not in acute distress.    Appearance: Normal appearance. He is well-developed, well-groomed and normal weight. He is not ill-appearing, toxic-appearing or diaphoretic.  HENT:     Head: Normocephalic and atraumatic.     Jaw: There is normal jaw occlusion. No trismus, tenderness, swelling, pain on movement or malocclusion.     Salivary Glands: Right salivary gland is not diffusely enlarged. Left salivary gland is not diffusely enlarged.     Right Ear: Hearing and external ear normal.     Left Ear: Hearing and external ear normal.     Nose: Nose normal.     Mouth/Throat:     Mouth: No angioedema.     Pharynx: Oropharynx is clear.  Eyes:     General: Lids are normal. Vision grossly intact. Gaze aligned appropriately. No allergic shiner, visual field deficit or scleral icterus.       Right eye: No discharge.        Left eye: No discharge.     Extraocular Movements: Extraocular movements intact.     Conjunctiva/sclera: Conjunctivae normal.     Pupils: Pupils are equal, round, and reactive to light.  Neck:     Trachea: Trachea and phonation normal.  Cardiovascular:     Rate and Rhythm: Normal rate and regular rhythm.     Pulses:          Radial pulses are 2+ on the right side and 2+ on the left side.     Heart sounds: Normal heart sounds.  Pulmonary:     Effort: Pulmonary effort is normal. No respiratory distress.     Breath sounds: Normal breath  sounds and air entry. No stridor, decreased air movement or transmitted upper airway sounds. No decreased breath sounds, wheezing, rhonchi or rales.     Comments: No cough observed in exam room; spoke full sentences without difficulty; wearing cloth mask due to covid 19 pandemic Abdominal:     General: Abdomen is flat.     Palpations: Abdomen is soft.  Musculoskeletal:        General: Swelling and tenderness present. No deformity or signs of injury. Normal  range of motion.     Right shoulder: Normal.     Left shoulder: Normal.     Right elbow: Normal.     Left elbow: Normal.     Right hand: Normal.     Left hand: Normal.     Cervical back: Normal, normal range of motion and neck supple. No swelling, edema, deformity, erythema, signs of trauma, lacerations, rigidity, torticollis or crepitus. No pain with movement. Normal range of motion.     Thoracic back: Normal.     Lumbar back: Normal.     Right hip: Normal.     Left hip: Normal.     Right upper leg: Normal.     Left upper leg: Swelling and tenderness present. No edema, deformity, lacerations or bony tenderness.     Right knee: Normal.     Left knee: Normal.     Right lower leg: No edema.     Left lower leg: No edema.     Right ankle: Normal.     Left ankle: Normal.       Legs:     Comments: Grouped pustules on erythematous base 1.5cm diameter medial left thigh TTP discharge on bandaid when removed white  Lymphadenopathy:     Head:     Right side of head: No submental, submandibular, tonsillar or preauricular adenopathy.     Left side of head: No submental, submandibular, tonsillar or preauricular adenopathy.     Cervical: No cervical adenopathy.     Right cervical: No superficial cervical adenopathy.    Left cervical: No superficial cervical adenopathy.     Lower Body: No right inguinal adenopathy. No left inguinal adenopathy.  Skin:    General: Skin is warm and dry.     Capillary Refill: Capillary refill takes less than 2 seconds.     Coloration: Skin is not ashen, cyanotic, jaundiced, mottled, pale or sallow.     Findings: Erythema and rash present. No abrasion, abscess, acne, bruising, burn, ecchymosis, signs of injury, laceration, lesion, petechiae or wound. Rash is macular, papular and pustular. Rash is not crusting, nodular, purpuric, scaling, urticarial or vesicular.     Nails: There is no clubbing.          Comments: Grouped pustules/papules nummular on erythematous  base no induration/fluctuance slightly TTP; diameter 1.5cm pustules and 2.5cm erythema  Neurological:     General: No focal deficit present.     Mental Status: He is alert and oriented to person, place, and time. Mental status is at baseline.     GCS: GCS eye subscore is 4. GCS verbal subscore is 5. GCS motor subscore is 6.     Cranial Nerves: Cranial nerves are intact. No cranial nerve deficit, dysarthria or facial asymmetry.     Sensory: Sensation is intact. No sensory deficit.     Motor: Motor function is intact. No weakness, tremor, atrophy, abnormal muscle tone or seizure activity.     Coordination: Coordination is intact. Coordination normal.  Gait: Gait is intact. Gait normal.     Comments: Gait sure and steady in clinic; in/out of chair without difficulty; bilateral hand grasp equal 5/5  Psychiatric:        Attention and Perception: Attention and perception normal.        Mood and Affect: Mood and affect normal.        Speech: Speech normal.        Behavior: Behavior normal. Behavior is cooperative.        Thought Content: Thought content normal.        Cognition and Memory: Cognition normal.        Judgment: Judgment normal.       Per Epic review last exec panel Dec 2020 Results for James Stanley, James Stanley (MRN [id number]) as of 11/07/2019 22:06  Ref. Range 03/21/2019 08:26  Sodium Latest Ref Range: 135 - 145 mEq/L 137  Potassium Latest Ref Range: 3.5 - 5.1 mEq/L 4.2  Chloride Latest Ref Range: 96 - 112 mEq/L 101  CO2 Latest Ref Range: 19 - 32 mEq/L 29  Glucose Latest Ref Range: 70 - 99 mg/dL 207 (H)  BUN Latest Ref Range: 6 - 23 mg/dL 12  Creatinine Latest Ref Range: 0.40 - 1.50 mg/dL 0.78  Calcium Latest Ref Range: 8.4 - 10.5 mg/dL 8.9  Alkaline Phosphatase Latest Ref Range: 39 - 117 U/L 91  Albumin Latest Ref Range: 3.5 - 5.2 g/dL 4.4  AST Latest Ref Range: 0 - 37 U/L 13  ALT Latest Ref Range: 0 - 53 U/L 9  Total Protein Latest Ref Range: 6.0 - 8.3 g/dL 6.8  Total  Bilirubin Latest Ref Range: 0.2 - 1.2 mg/dL 0.5  GFR Latest Ref Range: >60.00 mL/min 111.86  Total CHOL/HDL Ratio Unknown 2  Cholesterol Latest Ref Range: 0 - 200 mg/dL 168  HDL Cholesterol Latest Ref Range: >39.00 mg/dL 78.70  LDL (calc) Latest Ref Range: 0 - 99 mg/dL 82  NonHDL Unknown 89.65  Triglycerides Latest Ref Range: 0 - 149 mg/dL 39.0  VLDL Latest Ref Range: 0.0 - 40.0 mg/dL 7.8  Hemoglobin A1C Latest Ref Range: 4.6 - 6.5 % 9.0 (H)      Assessment & Plan:  A-cellulitis left medial thigh initial visit, elevated blood pressure, Type 1 diabetes uncontrolled insulin dependent  P-[facility] handout on skin infection and abscess printed and given to patient. Discussed possible side effects wear sunscreen/sun protective clothing.   Doxycycline 100mg  po BID x 7 days and will re-evaluate in one week may need to take up to 14 days #20 RF0 dispensed from [facility] to patient. Has bactroban 2% at home restart topical BID after washing affected area with soap and water.  May apply warm compress daily to help bring circulation to area.  Do not puncture affected area or scratch to get to drain if drains on own let it but do not dig/use needle/scratch as could worsen/spready infection.  RTC if worsening erythema, pain, purulent discharge, fever after 48 hours on antibiotics.  May spread worsen a little over the next 24 hours to be expected but after that should be improving. Wash towels, washcloths, sheets in hot water with bleach every couple of days until infection resolved. Wash area with soap and water at least daily.  Apply bactroban daily to BID with dressing change.  Cover with bandage until healed over. If bandage wet change prn.  If shorts/pants sweat soaked change/bring extra pair clothing to work. Discussed uncontrolled diabetes can result in more skin infections.  Dry skin less resistant to infection also after shower perform skin exam and apply emollient daily.  Patient wears bikini style underwear  discussed with him I do not think underwear style/fabric is causing his problem as underwear does not touch affected area today most likely sweat soaked clothing/rubbing. Discussed unless shower head growing mold his daily shower probably not causing cellulitis.  Patient not using tub.   Patient verbalized understanding, agreed with plan of care and had no further questions at this time.   Will decrease caffeine intake had energy drink this am. Discussed ER if chest pain, worst headache of life, dyspnea or visual changes for re-evaluation.  Will see RN Hildred Alamin tomorrow for [facility] labs and before caffeine intake repeat BP check. Patient also reported he will complete his overdue fasting annual labs tomorrow am also male exec panel with Hgba1c.  PCM Clark has also ordered a testosterone for patient.  Patient to follow up with endocrinology and PCM (overdue)  Patient verbalized understanding information/instructions, agreed with plan of care and had no further questions at this time.

## 2019-11-08 ENCOUNTER — Ambulatory Visit: Payer: Self-pay | Admitting: *Deleted

## 2019-11-08 DIAGNOSIS — IMO0002 Reserved for concepts with insufficient information to code with codable children: Secondary | ICD-10-CM

## 2019-11-08 DIAGNOSIS — N529 Male erectile dysfunction, unspecified: Secondary | ICD-10-CM

## 2019-11-08 NOTE — Telephone Encounter (Signed)
Fasting labs drawn this morning.

## 2019-11-08 NOTE — Addendum Note (Signed)
Addended by: Beckie Busing on: 11/08/2019 09:18 AM   Modules accepted: Orders

## 2019-11-08 NOTE — Progress Notes (Signed)
Fasting labs per NP and pcp orders.

## 2019-11-09 LAB — COMPREHENSIVE METABOLIC PANEL
ALT: 13 IU/L (ref 0–44)
AST: 20 IU/L (ref 0–40)
Albumin/Globulin Ratio: 2 (ref 1.2–2.2)
Albumin: 4.5 g/dL (ref 4.0–5.0)
Alkaline Phosphatase: 112 IU/L (ref 48–121)
BUN/Creatinine Ratio: 25 — ABNORMAL HIGH (ref 9–20)
BUN: 20 mg/dL (ref 6–20)
Bilirubin Total: <0.2 mg/dL (ref 0.0–1.2)
CO2: 22 mmol/L (ref 20–29)
Calcium: 8.9 mg/dL (ref 8.7–10.2)
Chloride: 101 mmol/L (ref 96–106)
Creatinine, Ser: 0.79 mg/dL (ref 0.76–1.27)
GFR calc Af Amer: 133 mL/min/{1.73_m2} (ref >59)
GFR calc non Af Amer: 115 mL/min/{1.73_m2} (ref >59)
Globulin, Total: 2.3 g/dL (ref 1.5–4.5)
Glucose: 132 mg/dL — ABNORMAL HIGH (ref 65–99)
Potassium: 4.1 mmol/L (ref 3.5–5.2)
Sodium: 141 mmol/L (ref 134–144)
Total Protein: 6.8 g/dL (ref 6.0–8.5)

## 2019-11-09 LAB — CBC WITH DIFFERENTIAL/PLATELET
Basophils Absolute: 0 10*3/uL (ref 0.0–0.2)
Basos: 1 %
EOS (ABSOLUTE): 0.1 10*3/uL (ref 0.0–0.4)
Eos: 1 %
Hematocrit: 38.3 % (ref 37.5–51.0)
Hemoglobin: 13 g/dL (ref 13.0–17.7)
Immature Grans (Abs): 0 10*3/uL (ref 0.0–0.1)
Immature Granulocytes: 0 %
Lymphocytes Absolute: 1.2 10*3/uL (ref 0.7–3.1)
Lymphs: 20 %
MCH: 32.2 pg (ref 26.6–33.0)
MCHC: 33.9 g/dL (ref 31.5–35.7)
MCV: 95 fL (ref 79–97)
Monocytes Absolute: 0.4 10*3/uL (ref 0.1–0.9)
Monocytes: 7 %
Neutrophils Absolute: 4.3 10*3/uL (ref 1.4–7.0)
Neutrophils: 71 %
Platelets: 264 10*3/uL (ref 150–450)
RBC: 4.04 x10E6/uL — ABNORMAL LOW (ref 4.14–5.80)
RDW: 11.3 % — ABNORMAL LOW (ref 11.6–15.4)
WBC: 6 10*3/uL (ref 3.4–10.8)

## 2019-11-09 LAB — TSH: TSH: 1.13 u[IU]/mL (ref 0.450–4.500)

## 2019-11-09 LAB — TESTOSTERONE: Testosterone: 337 ng/dL (ref 264–916)

## 2019-11-09 LAB — HGB A1C W/O EAG: Hgb A1c MFr Bld: 9.7 % — ABNORMAL HIGH (ref 4.8–5.6)

## 2019-11-11 DIAGNOSIS — E1065 Type 1 diabetes mellitus with hyperglycemia: Secondary | ICD-10-CM

## 2019-11-11 MED ORDER — FREESTYLE LIBRE 14 DAY SENSOR MISC: 1 refills | Status: DC

## 2019-11-11 NOTE — Telephone Encounter (Signed)
Refill has been sent.  °

## 2019-11-26 ENCOUNTER — Ambulatory Visit: Payer: Self-pay | Admitting: Registered Nurse

## 2019-11-26 ENCOUNTER — Other Ambulatory Visit: Payer: Self-pay

## 2019-11-26 VITALS — BP 118/84 | HR 85 | Temp 97.4°F

## 2019-11-26 DIAGNOSIS — R030 Elevated blood-pressure reading, without diagnosis of hypertension: Secondary | ICD-10-CM

## 2019-11-26 DIAGNOSIS — L03317 Cellulitis of buttock: Secondary | ICD-10-CM

## 2019-11-26 MED ORDER — MUPIROCIN 2 % EX OINT: 1.0000 "application " | TOPICAL_OINTMENT | Freq: Two times a day (BID) | CUTANEOUS | 0 refills | Status: DC

## 2019-11-26 MED ORDER — DOXYCYCLINE HYCLATE 100 MG PO TABS: 100.0000 mg | ORAL_TABLET | Freq: Two times a day (BID) | ORAL | 0 refills | Status: AC

## 2019-11-26 NOTE — Patient Instructions (Signed)
Diabetes Mellitus and Foot Care Foot care is an important part of your health, especially when you have diabetes. Diabetes may cause you to have problems because of poor blood flow (circulation) to your feet and legs, which can cause your skin to:  Become thinner and drier.  Break more easily.  Heal more slowly.  Peel and crack. You may also have nerve damage (neuropathy) in your legs and feet, causing decreased feeling in them. This means that you may not notice minor injuries to your feet that could lead to more serious problems. Noticing and addressing any potential problems early is the best way to prevent future foot problems. How to care for your feet Foot hygiene  Wash your feet daily with warm water and mild soap. Do not use hot water. Then, pat your feet and the areas between your toes until they are completely dry. Do not soak your feet as this can dry your skin.  Trim your toenails straight across. Do not dig under them or around the cuticle. File the edges of your nails with an emery board or nail file.  Apply a moisturizing lotion or petroleum jelly to the skin on your feet and to dry, brittle toenails. Use lotion that does not contain alcohol and is unscented. Do not apply lotion between your toes. Shoes and socks  Wear clean socks or stockings every day. Make sure they are not too tight. Do not wear knee-high stockings since they may decrease blood flow to your legs.  Wear shoes that fit properly and have enough cushioning. Always look in your shoes before you put them on to be sure there are no objects inside.  To break in new shoes, wear them for just a few hours a day. This prevents injuries on your feet. Wounds, scrapes, corns, and calluses  Check your feet daily for blisters, cuts, bruises, sores, and redness. If you cannot see the bottom of your feet, use a mirror or ask someone for help.  Do not cut corns or calluses or try to remove them with medicine.  If you  find a minor scrape, cut, or break in the skin on your feet, keep it and the skin around it clean and dry. You may clean these areas with mild soap and water. Do not clean the area with peroxide, alcohol, or iodine.  If you have a wound, scrape, corn, or callus on your foot, look at it several times a day to make sure it is healing and not infected. Check for: ? Redness, swelling, or pain. ? Fluid or blood. ? Warmth. ? Pus or a bad smell. General instructions  Do not cross your legs. This may decrease blood flow to your feet.  Do not use heating pads or hot water bottles on your feet. They may burn your skin. If you have lost feeling in your feet or legs, you may not know this is happening until it is too late.  Protect your feet from hot and cold by wearing shoes, such as at the beach or on hot pavement.  Schedule a complete foot exam at least once a year (annually) or more often if you have foot problems. If you have foot problems, report any cuts, sores, or bruises to your health care provider immediately. Contact a health care provider if:  You have a medical condition that increases your risk of infection and you have any cuts, sores, or bruises on your feet.  You have an injury that is not   healing.  You have redness on your legs or feet.  You feel burning or tingling in your legs or feet.  You have pain or cramps in your legs and feet.  Your legs or feet are numb.  Your feet always feel cold.  You have pain around a toenail. Get help right away if:  You have a wound, scrape, corn, or callus on your foot and: ? You have pain, swelling, or redness that gets worse. ? You have fluid or blood coming from the wound, scrape, corn, or callus. ? Your wound, scrape, corn, or callus feels warm to the touch. ? You have pus or a bad smell coming from the wound, scrape, corn, or callus. ? You have a fever. ? You have a red line going up your leg. Summary  Check your feet every day  for cuts, sores, red spots, swelling, and blisters.  Moisturize feet and legs daily.  Wear shoes that fit properly and have enough cushioning.  If you have foot problems, report any cuts, sores, or bruises to your health care provider immediately.  Schedule a complete foot exam at least once a year (annually) or more often if you have foot problems. This information is not intended to replace advice given to you by your health care provider. Make sure you discuss any questions you have with your health care provider. Document Revised: 12/19/2018 Document Reviewed: 04/29/2016 Else[employer] Patient Education  2020 [employer] Cellulitis, Adult  Cellulitis is a skin infection. The infected area is usually warm, red, swollen, and tender. This condition occurs most often in the arms and lower legs. The infection can travel to the muscles, blood, and underlying tissue and become serious. It is very important to get treated for this condition. What are the causes? Cellulitis is caused by bacteria. The bacteria enter through a break in the skin, such as a cut, burn, insect bite, open sore, or crack. What increases the risk? This condition is more likely to occur in people who:  Have a weak body defense system (immune system).  Have open wounds on the skin, such as cuts, burns, bites, and scrapes. Bacteria can enter the body through these open wounds.  Are older than 38 years of age.  Have diabetes.  Have a type of long-lasting (chronic) liver disease (cirrhosis) or kidney disease.  Are obese.  Have a skin condition such as: ? Itchy rash (eczema). ? Slow movement of blood in the veins (venous stasis). ? Fluid buildup below the skin (edema).  Have had radiation therapy.  Use IV drugs. What are the signs or symptoms? Symptoms of this condition include:  Redness, streaking, or spotting on the skin.  Swollen area of the skin.  Tenderness or pain when an area of the skin is  touched.  Warm skin.  A fever.  Chills.  Blisters. How is this diagnosed? This condition is diagnosed based on a medical history and physical exam. You may also have tests, including:  Blood tests.  Imaging tests. How is this treated? Treatment for this condition may include:  Medicines, such as antibiotic medicines or medicines to treat allergies (antihistamines).  Supportive care, such as rest and application of cold or warm cloths (compresses) to the skin.  Hospital care, if the condition is severe. The infection usually starts to get better within 1-2 days of treatment. Follow these instructions at home:  Medicines  Take over-the-counter and prescription medicines only as told by your health care provider.  If you   were prescribed an antibiotic medicine, take it as told by your health care provider. Do not stop taking the antibiotic even if you start to feel better. General instructions  Drink enough fluid to keep your urine pale yellow.  Do not touch or rub the infected area.  Raise (elevate) the infected area above the level of your heart while you are sitting or lying down.  Apply warm or cold compresses to the affected area as told by your health care provider.  Keep all follow-up visits as told by your health care provider. This is important. These visits let your health care provider make sure a more serious infection is not developing. Contact a health care provider if:  You have a fever.  Your symptoms do not begin to improve within 1-2 days of starting treatment.  Your bone or joint underneath the infected area becomes painful after the skin has healed.  Your infection returns in the same area or another area.  You notice a swollen bump in the infected area.  You develop new symptoms.  You have a general ill feeling (malaise) with muscle aches and pains. Get help right away if:  Your symptoms get worse.  You feel very sleepy.  You develop  vomiting or diarrhea that persists.  You notice red streaks coming from the infected area.  Your red area gets larger or turns dark in color. These symptoms may represent a serious problem that is an emergency. Do not wait to see if the symptoms will go away. Get medical help right away. Call your local emergency services (911 in the [place] Do not drive yourself to the hospital. Summary  Cellulitis is a skin infection. This condition occurs most often in the arms and lower legs.  Treatment for this condition may include medicines, such as antibiotic medicines or antihistamines.  Take over-the-counter and prescription medicines only as told by your health care provider. If you were prescribed an antibiotic medicine, do not stop taking the antibiotic even if you start to feel better.  Contact a health care provider if your symptoms do not begin to improve within 1-2 days of starting treatment or your symptoms get worse.  Keep all follow-up visits as told by your health care provider. This is important. These visits let your health care provider make sure that a more serious infection is not developing. This information is not intended to replace advice given to you by your health care provider. Make sure you discuss any questions you have with your health care provider. Document Revised: 08/17/2017 Document Reviewed: 08/17/2017 Elsevier Patient Education  2020 [employer]  

## 2019-11-26 NOTE — Progress Notes (Signed)
Subjective:    Patient ID: James Stanley, male    DOB: 08/05/1980, 39 y.o.   MRN: [id number]  39y/o Caucasian established male pt c/o recurrent skin infection to R buttock. Groin abscess treated 11/07/19 has resolved with doxycycline, but noted this one popped up yesterday at a location that has previously been infected. Not open or draining at this time. Area is itchy and tender.   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  Denied fever/body aches.  Working on getting his blood sugars reduced for next Hgba1c to be closer to 7.  Has been applying lotion daily after shower and trying baby powder to see if helps decrease outbreaks.      Review of Systems  Constitutional: Negative for activity change, appetite change, chills, fatigue and fever.  HENT: Negative for trouble swallowing and voice change.   Eyes: Negative for photophobia and visual disturbance.  Respiratory: Negative for cough, shortness of breath, wheezing and stridor.   Cardiovascular: Negative for chest pain and leg swelling.  Gastrointestinal: Negative for diarrhea, nausea and vomiting.  Endocrine: Negative for cold intolerance and heat intolerance.  Genitourinary: Negative for difficulty urinating.  Musculoskeletal: Negative for gait problem, joint swelling, myalgias, neck pain and neck stiffness.  Skin: Positive for color change and rash. Negative for pallor and wound.  Allergic/Immunologic: Positive for immunocompromised state. Negative for food allergies.  Neurological: Negative for dizziness, tremors, seizures, syncope, facial asymmetry, speech difficulty, weakness, light-headedness, numbness and headaches.  Hematological: Negative for adenopathy. Does not bruise/bleed easily.  Psychiatric/Behavioral: Negative for agitation, confusion and sleep disturbance.       Objective:   Physical  Exam Constitutional:      General: He is awake. He is not in acute distress.    Appearance: Normal appearance. He is well-developed, well-groomed and normal weight. He is not ill-appearing, toxic-appearing or diaphoretic.  HENT:     Head: Normocephalic and atraumatic.     Jaw: There is normal jaw occlusion.     Salivary Glands: Right salivary gland is not diffusely enlarged. Left salivary gland is not diffusely enlarged.     Right Ear: Hearing and external ear normal.     Left Ear: Hearing and external ear normal.     Nose: Nose normal. No congestion.     Mouth/Throat:     Mouth: No angioedema.     Pharynx: Oropharynx is clear.  Eyes:     General: Lids are normal. Vision grossly intact. Gaze aligned appropriately. No allergic shiner, visual field deficit or scleral icterus.       Right eye: No discharge.        Left eye: No discharge.     Extraocular Movements: Extraocular movements intact.     Conjunctiva/sclera: Conjunctivae normal.     Pupils: Pupils are equal, round, and reactive to light.  Neck:     Thyroid: No thyromegaly.     Trachea: Trachea and phonation normal.  Cardiovascular:     Rate and Rhythm: Normal rate and regular rhythm.     Pulses: Normal pulses.          Radial pulses are 2+ on the right side and 2+ on the left side.  Pulmonary:     Effort: Pulmonary effort is normal. No respiratory distress.     Breath sounds: Normal breath sounds and air entry. No stridor, decreased air movement or transmitted upper  airway sounds. No wheezing, rhonchi or rales.  Abdominal:     General: Abdomen is flat.     Palpations: Abdomen is soft.  Musculoskeletal:        General: Normal range of motion.     Right shoulder: Normal.     Left shoulder: Normal.     Right elbow: Normal.     Left elbow: Normal.     Right hand: Normal.     Left hand: Normal.     Cervical back: Normal, normal range of motion and neck supple. No swelling, edema, deformity, erythema, signs of trauma,  lacerations, rigidity, torticollis or crepitus. No pain with movement. Normal range of motion.     Thoracic back: Normal.     Lumbar back: Normal.     Right hip: Normal.     Left hip: Normal.     Right knee: Normal.     Left knee: Normal.  Lymphadenopathy:     Head:     Right side of head: No submental, submandibular, tonsillar or preauricular adenopathy.     Left side of head: No submental, submandibular, tonsillar or preauricular adenopathy.     Cervical: No cervical adenopathy.     Right cervical: No superficial cervical adenopathy.    Left cervical: No superficial cervical adenopathy.  Skin:    General: Skin is warm and dry.     Capillary Refill: Capillary refill takes less than 2 seconds.     Coloration: Skin is not ashen, cyanotic, jaundiced, mottled, pale or sallow.     Findings: Abrasion, erythema and rash present. No abscess, acne, bruising, burn, ecchymosis, signs of injury, laceration, lesion, petechiae or wound. Rash is macular and papular. Rash is not crusting, nodular, purpuric, pustular, scaling, urticarial or vesicular.     Nails: There is no clubbing.       Neurological:     General: No focal deficit present.     Mental Status: He is alert and oriented to person, place, and time. Mental status is at baseline.     GCS: GCS eye subscore is 4. GCS verbal subscore is 5. GCS motor subscore is 6.     Cranial Nerves: Cranial nerves are intact. No cranial nerve deficit, dysarthria or facial asymmetry.     Sensory: Sensation is intact. No sensory deficit.     Motor: No weakness, tremor, atrophy, abnormal muscle tone or seizure activity.     Coordination: Coordination is intact. Coordination normal.     Gait: Gait is intact. Gait normal.     Comments: Gait sure and steady in clinic; in/out of chair without difficulty; bilateral hand grasp equal 5/5  Psychiatric:        Attention and Perception: Attention and perception normal.        Mood and Affect: Mood and affect normal.         Speech: Speech normal.        Behavior: Behavior normal. Behavior is cooperative.        Thought Content: Thought content normal.        Cognition and Memory: Cognition and memory normal.        Judgment: Judgment normal.           Assessment & Plan:  A-cellulitis buttock initial visit, elevated blood pressure  P-[facility] handout on skin infection printed and given to patient. Discussed possible side effects doxycycline wear sunscreen/sun protective clothing.   Doxycycline 100mg  po BID x 7 days and will re-evaluate in one week may  need to take up to 14 days #20 RF0 dispensed from [facility] to patient. Electronic Rx to his pharmacy of choice start bactroban 2% topical BID after washing affected area with soap and water daily until area healed.  Given triple antibiotic UD from clinic stock to use until he can pick up his mupirocin new Rx.  May apply warm compress daily to help bring circulation to area.  Do not puncture affected area or scratch to get to drain; if drains on own let it but do not dig/use needle/scratch as could worsen/spread infection. Discussed no known studies that show baby powder helps to prevent skin infections.  Discussed antifungal powder helpful with fungal infections.  Most important is consistent daily skin care regimen/moisterizing and diabetic skin checks daily. Do not apply baby powder to affected area.  Discussed if sweat soaking clothes bring a second set to change into on lunch break.  Encouraged patient to make health dietary choices, take his diabetic medications and goal to reduce Hgba1c closer to 7.  Discussed with patient I think elevated blood sugars, sweating/sweat soaked clothes are contributing to his recurrent cellulitis.  Discussed after shower apply moisturizer/emollient and perform skin check daily.  Consider hibiclens shower if outbreaks not decreasing as Hgba1c improves.  RTC if worsening erythema, pain, purulent discharge, fever after 48 hours on  antibiotics.  May spread worsen a little over the next 24 hours to be expected but after that should be improving. Wash towels, washcloths, sheets in hot water with bleach every couple of days until infection resolved. Wash area with soap and water at least daily. Avoid scratching.  Cover affected area with bandage until healed over.   Patient verbalized understanding, agreed with plan of care and had no further questions at this time.   Patient had coffee/caffeine this am.  Blood pressure improved today but still elevated.  Still with acute illness/mild pain.  Encouraged decrease caffeine intake Discussed ER if chest pain, worst headache of life, dyspnea or visual changes for re-evaluation.  Will continue to recheck BP at follow up visits/monitor. Patient verbalized understanding information/instructions, agreed with plan of care and had no further questions at this time.

## 2019-12-05 ENCOUNTER — Encounter: Payer: Self-pay | Admitting: Registered Nurse

## 2019-12-05 ENCOUNTER — Ambulatory Visit: Payer: Self-pay | Admitting: Registered Nurse

## 2019-12-05 ENCOUNTER — Other Ambulatory Visit: Payer: Self-pay

## 2019-12-05 VITALS — Resp 16

## 2019-12-05 DIAGNOSIS — L03317 Cellulitis of buttock: Secondary | ICD-10-CM

## 2019-12-05 MED ORDER — HIBICLENS 4 % EX LIQD: Freq: Every day | CUTANEOUS | 0 refills | Status: AC | PRN

## 2019-12-05 MED ORDER — AQUAPHOR EX OINT: TOPICAL_OINTMENT | Freq: Every day | CUTANEOUS | 0 refills | Status: AC

## 2019-12-05 MED ORDER — DOXYCYCLINE HYCLATE 100 MG PO TABS: 100.0000 mg | ORAL_TABLET | Freq: Two times a day (BID) | ORAL | 0 refills | Status: AC

## 2019-12-05 NOTE — Patient Instructions (Signed)
Hidradenitis Suppurativa Hidradenitis suppurativa is a long-term (chronic) skin disease. It is similar to a severe form of acne, but it affects areas of the body where acne would be unusual, especially areas of the body where skin rubs against skin and becomes moist. These include:  Underarms.  Groin.  Genital area.  Buttocks.  Upper thighs.  Breasts. Hidradenitis suppurativa may start out as small lumps or pimples caused by blocked sweat glands or hair follicles. Pimples may develop into deep sores that break open (rupture) and drain pus. Over time, affected areas of skin may thicken and become scarred. This condition is rare and does not spread from person to person (non-contagious). What are the causes? The exact cause of this condition is not known. It may be related to:  Male and male hormones.  An overactive disease-fighting system (immune system). The immune system may over-react to blocked hair follicles or sweat glands and cause swelling and pus-filled sores. What increases the risk? You are more likely to develop this condition if you:  Are male.  Are 11-55 years old.  Have a family history of hidradenitis suppurativa.  Have a personal history of acne.  Are overweight.  Smoke.  Take the medicine lithium. What are the signs or symptoms? The first symptoms are usually painful bumps in the skin, similar to pimples. The condition may get worse over time (progress), or it may only cause mild symptoms. If the disease progresses, symptoms may include:  Skin bumps getting bigger and growing deeper into the skin.  Bumps rupturing and draining pus.  Itchy, infected skin.  Skin getting thicker and scarred.  Tunnels under the skin (fistulas) where pus drains from a bump.  Pain during daily activities, such as pain during walking if your groin area is affected.  Emotional problems, such as stress or depression. This condition may affect your appearance and your  ability or willingness to wear certain clothes or do certain activities. How is this diagnosed? This condition is diagnosed by a health care provider who specializes in skin diseases (dermatologist). You may be diagnosed based on:  Your symptoms and medical history.  A physical exam.  Testing a pus sample for infection.  Blood tests. How is this treated? Your treatment will depend on how severe your symptoms are. The same treatment will not work for everybody with this condition. You may need to try several treatments to find what works best for you. Treatment may include:  Cleaning and bandaging (dressing) your wounds as needed.  Lifestyle changes, such as new skin care routines.  Taking medicines, such as: ? Antibiotics. ? Acne medicines. ? Medicines to reduce the activity of the immune system. ? A diabetes medicine (metformin). ? Birth control pills, for women. ? Steroids to reduce swelling and pain.  Working with a mental health care provider, if you experience emotional distress due to this condition. If you have severe symptoms that do not get better with medicine, you may need surgery. Surgery may involve:  Using a laser to clear the skin and remove hair follicles.  Opening and draining deep sores.  Removing the areas of skin that are diseased and scarred. Follow these instructions at home: Medicines   Take over-the-counter and prescription medicines only as told by your health care provider.  If you were prescribed an antibiotic medicine, take it as told by your health care provider. Do not stop taking the antibiotic even if your condition improves. Skin care  If you have open wounds, cover   them with a clean dressing as told by your health care provider. Keep wounds clean by washing them gently with soap and water when you bathe.  Do not shave the areas where you get hidradenitis suppurativa.  Do not wear deodorant.  Wear loose-fitting clothes.  Try to avoid  getting overheated or sweaty. If you get sweaty or wet, change into clean, dry clothes as soon as you can.  To help relieve pain and itchiness, cover sore areas with a warm, clean washcloth (warm compress) for 5-10 minutes as often as needed.  If told by your health care provider, take a bleach bath twice a week: ? Fill your bathtub halfway with water. ? Pour in  cup of unscented household bleach. ? Soak in the tub for 5-10 minutes. ? Only soak from the neck down. Avoid water on your face and hair. ? Shower to rinse off the bleach from your skin. General instructions  Learn as much as you can about your disease so that you have an active role in your treatment. Work closely with your health care provider to find treatments that work for you.  If you are overweight, work with your health care provider to lose weight as recommended.  Do not use any products that contain nicotine or tobacco, such as cigarettes and e-cigarettes. If you need help quitting, ask your health care provider.  If you struggle with living with this condition, talk with your health care provider or work with a mental health care provider as recommended.  Keep all follow-up visits as told by your health care provider. This is important. Where to find more information  Hidradenitis [place].: https://www.hs-foundation.org/ Contact a health care provider if you have:  A flare-up of hidradenitis suppurativa.  A fever or chills.  Trouble controlling your symptoms at home.  Trouble doing your daily activities because of your symptoms.  Trouble dealing with emotional problems related to your condition. Summary  Hidradenitis suppurativa is a long-term (chronic) skin disease. It is similar to a severe form of acne, but it affects areas of the body where acne would be unusual.  The first symptoms are usually painful bumps in the skin, similar to pimples. The condition may get worse over time  (progress), or it may only cause mild symptoms.  If you have open wounds, cover them with a clean dressing as told by your health care provider. Keep wounds clean by washing them gently with soap and water when you bathe.  Besides skin care, treatment may include medicines, laser treatment, and surgery. This information is not intended to replace advice given to you by your health care provider. Make sure you discuss any questions you have with your health care provider. Document Revised: 04/05/2017 Document Reviewed: 04/05/2017 Elsevier Patient Education  [place]. Cellulitis, Adult  Cellulitis is a skin infection. The infected area is usually warm, red, swollen, and tender. This condition occurs most often in the arms and lower legs. The infection can travel to the muscles, blood, and underlying tissue and become serious. It is very important to get treated for this condition. What are the causes? Cellulitis is caused by bacteria. The bacteria enter through a break in the skin, such as a cut, burn, insect bite, open sore, or crack. What increases the risk? This condition is more likely to occur in people who:  Have a weak body defense system (immune system).  Have open wounds on the skin, such as cuts, burns, bites, and scrapes.  Bacteria can enter the body through these open wounds.  Are older than 38 years of age.  Have diabetes.  Have a type of long-lasting (chronic) liver disease (cirrhosis) or kidney disease.  Are obese.  Have a skin condition such as: ? Itchy rash (eczema). ? Slow movement of blood in the veins (venous stasis). ? Fluid buildup below the skin (edema).  Have had radiation therapy.  Use IV drugs. What are the signs or symptoms? Symptoms of this condition include:  Redness, streaking, or spotting on the skin.  Swollen area of the skin.  Tenderness or pain when an area of the skin is touched.  Warm skin.  A  fever.  Chills.  Blisters. How is this diagnosed? This condition is diagnosed based on a medical history and physical exam. You may also have tests, including:  Blood tests.  Imaging tests. How is this treated? Treatment for this condition may include:  Medicines, such as antibiotic medicines or medicines to treat allergies (antihistamines).  Supportive care, such as rest and application of cold or warm cloths (compresses) to the skin.  Hospital care, if the condition is severe. The infection usually starts to get better within 1-2 days of treatment. Follow these instructions at home:  Medicines  Take over-the-counter and prescription medicines only as told by your health care provider.  If you were prescribed an antibiotic medicine, take it as told by your health care provider. Do not stop taking the antibiotic even if you start to feel better. General instructions  Drink enough fluid to keep your urine pale yellow.  Do not touch or rub the infected area.  Raise (elevate) the infected area above the level of your heart while you are sitting or lying down.  Apply warm or cold compresses to the affected area as told by your health care provider.  Keep all follow-up visits as told by your health care provider. This is important. These visits let your health care provider make sure a more serious infection is not developing. Contact a health care provider if:  You have a fever.  Your symptoms do not begin to improve within 1-2 days of starting treatment.  Your bone or joint underneath the infected area becomes painful after the skin has healed.  Your infection returns in the same area or another area.  You notice a swollen bump in the infected area.  You develop new symptoms.  You have a general ill feeling (malaise) with muscle aches and pains. Get help right away if:  Your symptoms get worse.  You feel very sleepy.  You develop vomiting or diarrhea that  persists.  You notice red streaks coming from the infected area.  Your red area gets larger or turns dark in color. These symptoms may represent a serious problem that is an emergency. Do not wait to see if the symptoms will go away. Get medical help right away. Call your local emergency services (911 in the [place] Do not drive yourself to the hospital. Summary  Cellulitis is a skin infection. This condition occurs most often in the arms and lower legs.  Treatment for this condition may include medicines, such as antibiotic medicines or antihistamines.  Take over-the-counter and prescription medicines only as told by your health care provider. If you were prescribed an antibiotic medicine, do not stop taking the antibiotic even if you start to feel better.  Contact a health care provider if your symptoms do not begin to improve within 1-2 days of starting  treatment or your symptoms get worse.  Keep all follow-up visits as told by your health care provider. This is important. These visits let your health care provider make sure that a more serious infection is not developing. This information is not intended to replace advice given to you by your health care provider. Make sure you discuss any questions you have with your health care provider. Document Revised: 08/17/2017 Document Reviewed: 08/17/2017 Elsevier Patient Education  [place].

## 2019-12-05 NOTE — Progress Notes (Signed)
Subjective:    Patient ID: James Stanley, male    DOB: 1980-12-19, 39 y.o.   MRN: [id number]  38y/o caucasian male established patient here for evaluation of new lesions buttock adjacent to previously evaluated rash.  Had diarrhea this weekend with doxycycline but resolved and denied any loose stools today.  Needs more doxycyline for new rash and wondering if he should be trying anything else.  New rash had purulent discharge this weekend.  Has stopped but still feels hard lump underneath the skin size of grape.  Applied mupirocin topical and has been washing with soap and water. 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 Denied fever/chills/body aches.  Patient visited in [place] today.  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     Review of Systems  Constitutional: Positive for activity change and diaphoresis. Negative for appetite change, chills, fatigue and fever.  HENT: Negative for trouble swallowing and voice change.   Eyes: Negative for photophobia and visual disturbance.  Respiratory: Negative for cough, shortness of breath, wheezing and stridor.   Cardiovascular: Negative for leg swelling.  Gastrointestinal: Positive for diarrhea. Negative for abdominal distention, abdominal pain, constipation, nausea and vomiting.  Endocrine: Negative for cold intolerance and heat intolerance.  Genitourinary: Negative for difficulty urinating.  Musculoskeletal: Negative for arthralgias, back pain, gait problem, joint swelling, myalgias, neck pain and neck stiffness.  Skin: Positive for rash.  Allergic/Immunologic: Positive for immunocompromised state.  Neurological: Negative for tremors, syncope and weakness.  Hematological: Negative for adenopathy. Does not bruise/bleed easily.  Psychiatric/Behavioral: Negative for agitation, confusion and sleep disturbance.       Objective:   Physical Exam Constitutional:       General: He is awake. He is not in acute distress.    Appearance: Normal appearance. He is well-developed, well-groomed and normal weight. He is diaphoretic. He is not ill-appearing or toxic-appearing.  HENT:     Head: Normocephalic and atraumatic.     Jaw: There is normal jaw occlusion.     Salivary Glands: Right salivary gland is not diffusely enlarged. Left salivary gland is not diffusely enlarged.     Right Ear: Hearing and external ear normal.     Left Ear: Hearing and external ear normal.     Nose: Nose normal.     Mouth/Throat:     Mouth: No angioedema.     Pharynx: Oropharynx is clear.  Eyes:     General: Lids are normal. Vision grossly intact. Gaze aligned appropriately. No visual field deficit or scleral icterus.       Right eye: No discharge.        Left eye: No discharge.     Extraocular Movements: Extraocular movements intact.     Conjunctiva/sclera: Conjunctivae normal.     Pupils: Pupils are equal, round, and reactive to light.  Neck:     Trachea: Trachea and phonation normal. No tracheostomy, abnormal tracheal secretions or tracheal deviation.  Pulmonary:     Effort: Pulmonary effort is normal. No respiratory distress.     Breath sounds: Normal breath sounds and air entry. No stridor or transmitted upper airway sounds. No wheezing.     Comments: Wearing cloth mask due to covid 19 pandemic; spoke full sentences without difficulty; no cough observed Abdominal:     General: Abdomen is flat.     Palpations: Abdomen is soft.  Musculoskeletal:  General: No swelling or deformity. Normal range of motion.     Right shoulder: Normal.     Left shoulder: Normal.     Right elbow: Normal.     Left elbow: Normal.     Right hand: Normal.     Left hand: Normal.     Cervical back: Normal, normal range of motion and neck supple. No rigidity.     Thoracic back: Normal.     Lumbar back: Normal.     Right hip: Normal.     Left hip: Normal.     Right knee: Normal.     Left  knee: Normal.     Right ankle: Normal.     Left ankle: Normal.  Lymphadenopathy:     Head:     Right side of head: No submental, submandibular or preauricular adenopathy.     Left side of head: No submental, submandibular or preauricular adenopathy.     Cervical: No cervical adenopathy.     Right cervical: No superficial cervical adenopathy.    Left cervical: No superficial cervical adenopathy.  Skin:    General: Skin is warm.     Capillary Refill: Capillary refill takes less than 2 seconds.     Coloration: Skin is not ashen, cyanotic, jaundiced, mottled, pale or sallow.     Findings: No abrasion, acne, bruising, burn, ecchymosis, laceration, lesion or petechiae.     Nails: There is no clubbing.     Comments: Deferred disrobing due to patient in workcenter and coworkers nearby/no privacy available; patient sweating face/chest/shoulders and all clothes sweat stained (tank top and shorts)  Neurological:     General: No focal deficit present.     Mental Status: He is alert and oriented to person, place, and time. Mental status is at baseline.     GCS: GCS eye subscore is 4. GCS verbal subscore is 5. GCS motor subscore is 6.     Cranial Nerves: Cranial nerves are intact. No cranial nerve deficit, dysarthria or facial asymmetry.     Sensory: Sensation is intact. No sensory deficit.     Motor: Motor function is intact. No weakness, tremor, atrophy, abnormal muscle tone or seizure activity.     Coordination: Coordination is intact. Coordination normal.     Gait: Gait is intact. Gait normal.     Comments: Gait sure and steady; bilateral hand grasp equal 5/5  Psychiatric:        Attention and Perception: Attention and perception normal.        Mood and Affect: Mood and affect normal.        Speech: Speech normal.        Behavior: Behavior normal. Behavior is cooperative.        Thought Content: Thought content normal.        Cognition and Memory: Cognition and memory normal.        Judgment:  Judgment normal.           Assessment & Plan:  A-recurrent buttock cellulitis  P-Exitcare handout on skin infection and hidradenitis supportivaprinted and given to patient. Discussed possible side effects doxycycline wear sunscreen/sun protective clothing. Doxycycline 100mg  po BID x 7 days and will re-evaluate in one week may need to take up to 14 days #20 RF0 dispensed from [facility] to patient. continue bactroban 2% topical BID after washing affected area with soap and water daily until area healed.  Start daily hibiclens 4% wash OTC only to buttocks avoid washing penis/getting hibiclens solution into  urethra.  Reinforced bring change of clothes so if sweat soaked can change on his lunch hour or break at work. May apply warm compress daily to help bring circulation to area. Do not puncture affected area or scratch to get to drain; if drains on own let it but do not dig/use needle/scratch as could worsen/spread infection. Most important is consistent daily skin care regimen/moisterizing and diabetic skin checks daily. Patient working on lowering his Hgba1c closer to 7 making healthier dietary choices, taking his diabetic medications.  Discussed with patient I think elevated blood sugars, sweating/sweat soaked clothes are contributing to his recurrent cellulitis.  Discuss increase fermented food intake and yogurt with active cultures that is not high in sugar eg. 2 good, plain greek, oikos triple greek (no added sugars/dyes)  Discussed after shower apply moisturizer/emollient and perform skin check daily.  RTC if diarrhea returns, worsening erythema, pain, purulent discharge, feverafter 48 hours on antibiotics. May spread worsen a little over the next 24 hours to be expected but after that should be improving. Wash towels, washcloths, sheets in hot water with bleach every couple of days until infection resolved. Wash area with soap and water at least daily. Avoid scratching. Cover affected area with  bandage until healed over. Patient verbalized understanding, agreed with plan of care and had no further questions at this time.

## 2019-12-07 ENCOUNTER — Other Ambulatory Visit: Payer: Self-pay | Admitting: Primary Care

## 2019-12-07 DIAGNOSIS — E1065 Type 1 diabetes mellitus with hyperglycemia: Secondary | ICD-10-CM

## 2019-12-27 ENCOUNTER — Other Ambulatory Visit: Payer: Self-pay | Admitting: Registered Nurse

## 2019-12-27 ENCOUNTER — Encounter: Payer: Self-pay | Admitting: Registered Nurse

## 2019-12-27 NOTE — Telephone Encounter (Signed)
Patient last seen 12/05/2019.  Requested refill mupirocin ointment.  Electronic Rx sent to his pharmacy of choice #1 RF0 22g tube.  If patient needs another refill will need clinic appt.  My chart message sent to patient.

## 2020-01-28 ENCOUNTER — Other Ambulatory Visit: Payer: Self-pay | Admitting: Primary Care

## 2020-01-28 DIAGNOSIS — E1065 Type 1 diabetes mellitus with hyperglycemia: Secondary | ICD-10-CM

## 2020-05-17 IMAGING — CR DG CHEST 2V
2 series · 2 of 2 positions shown · non-contrast
Comparison: None in PACs

CLINICAL DATA: Four day history of shortness of breath, dizziness,
and left-sided chest pain radiating to the left axillary region.

EXAM:
CHEST - 2 VIEW

[chest pa]
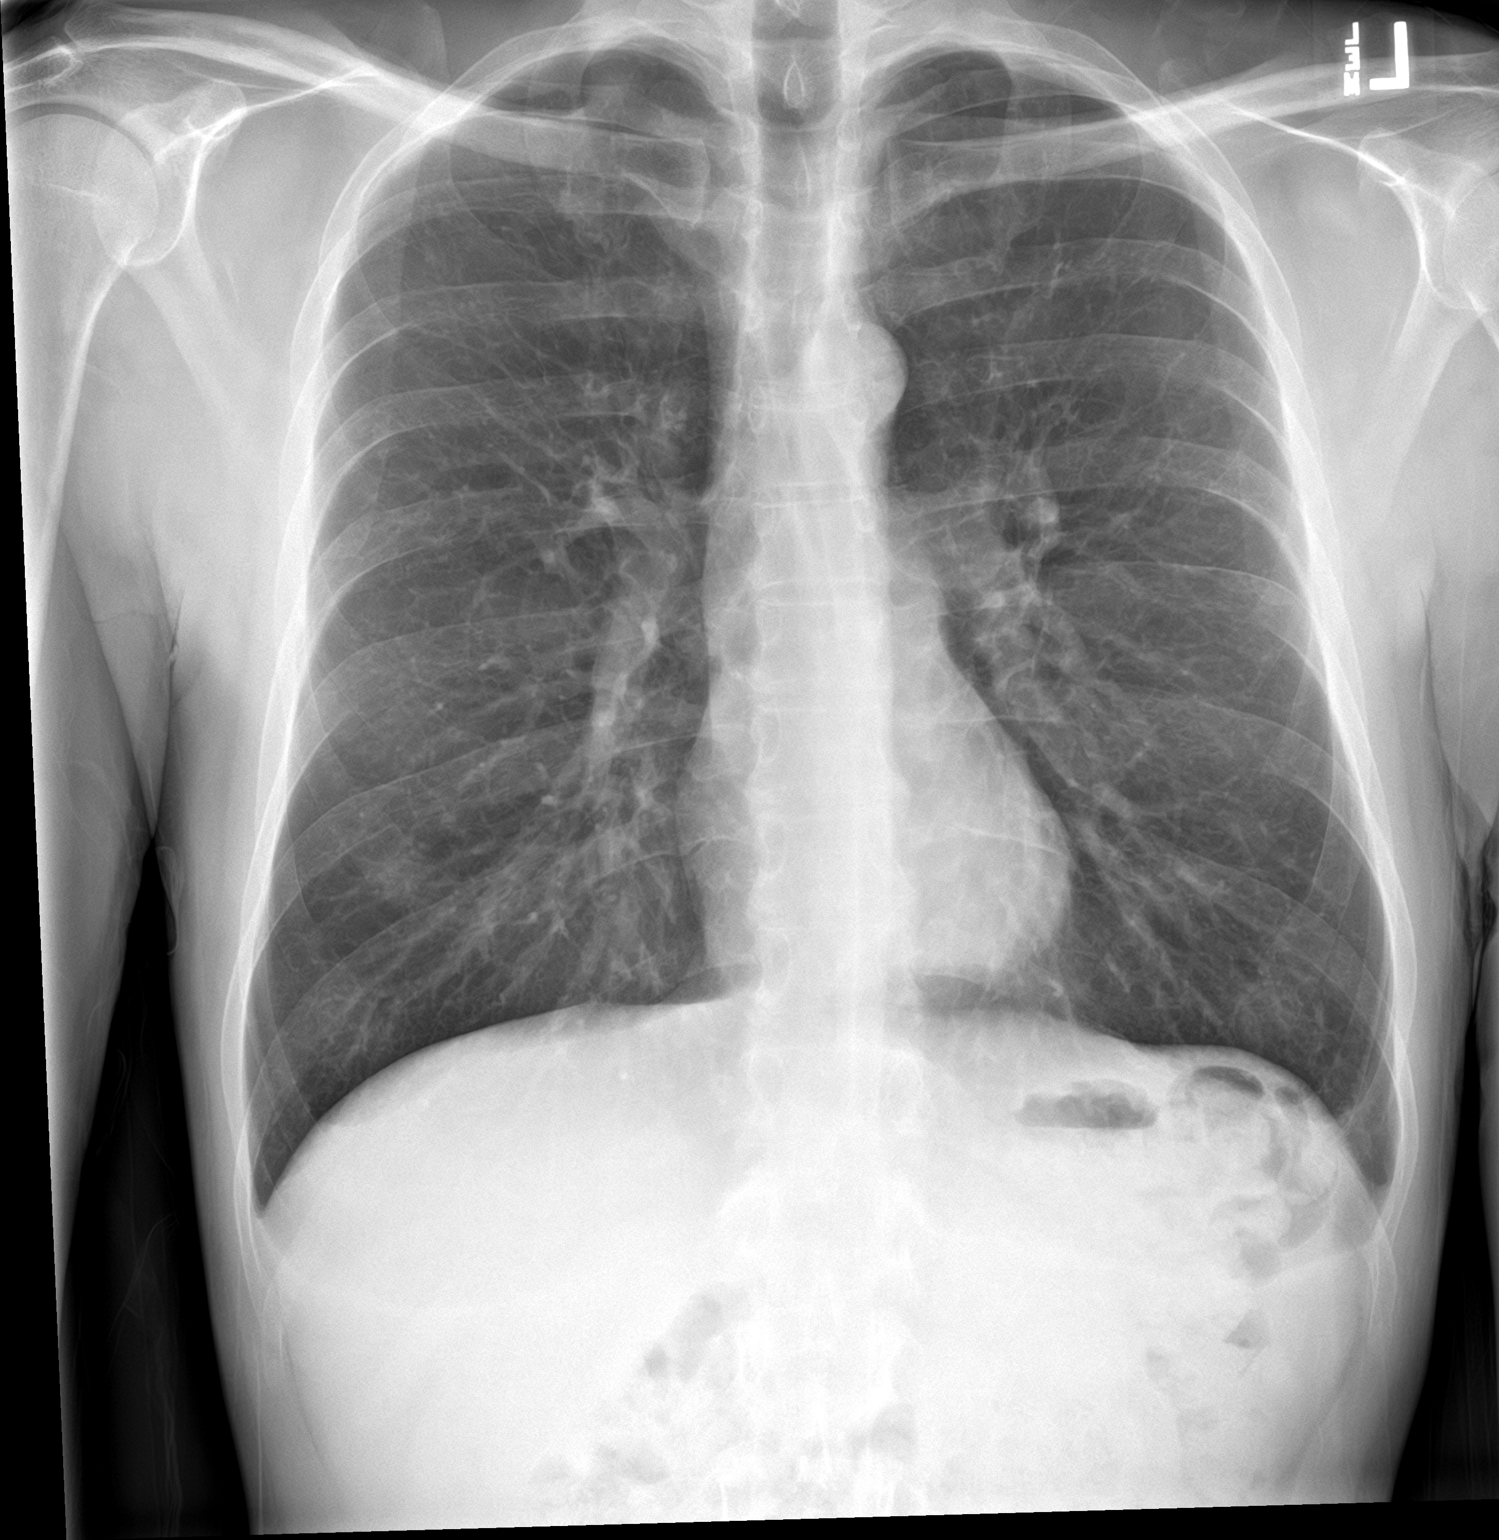

[chest lat]
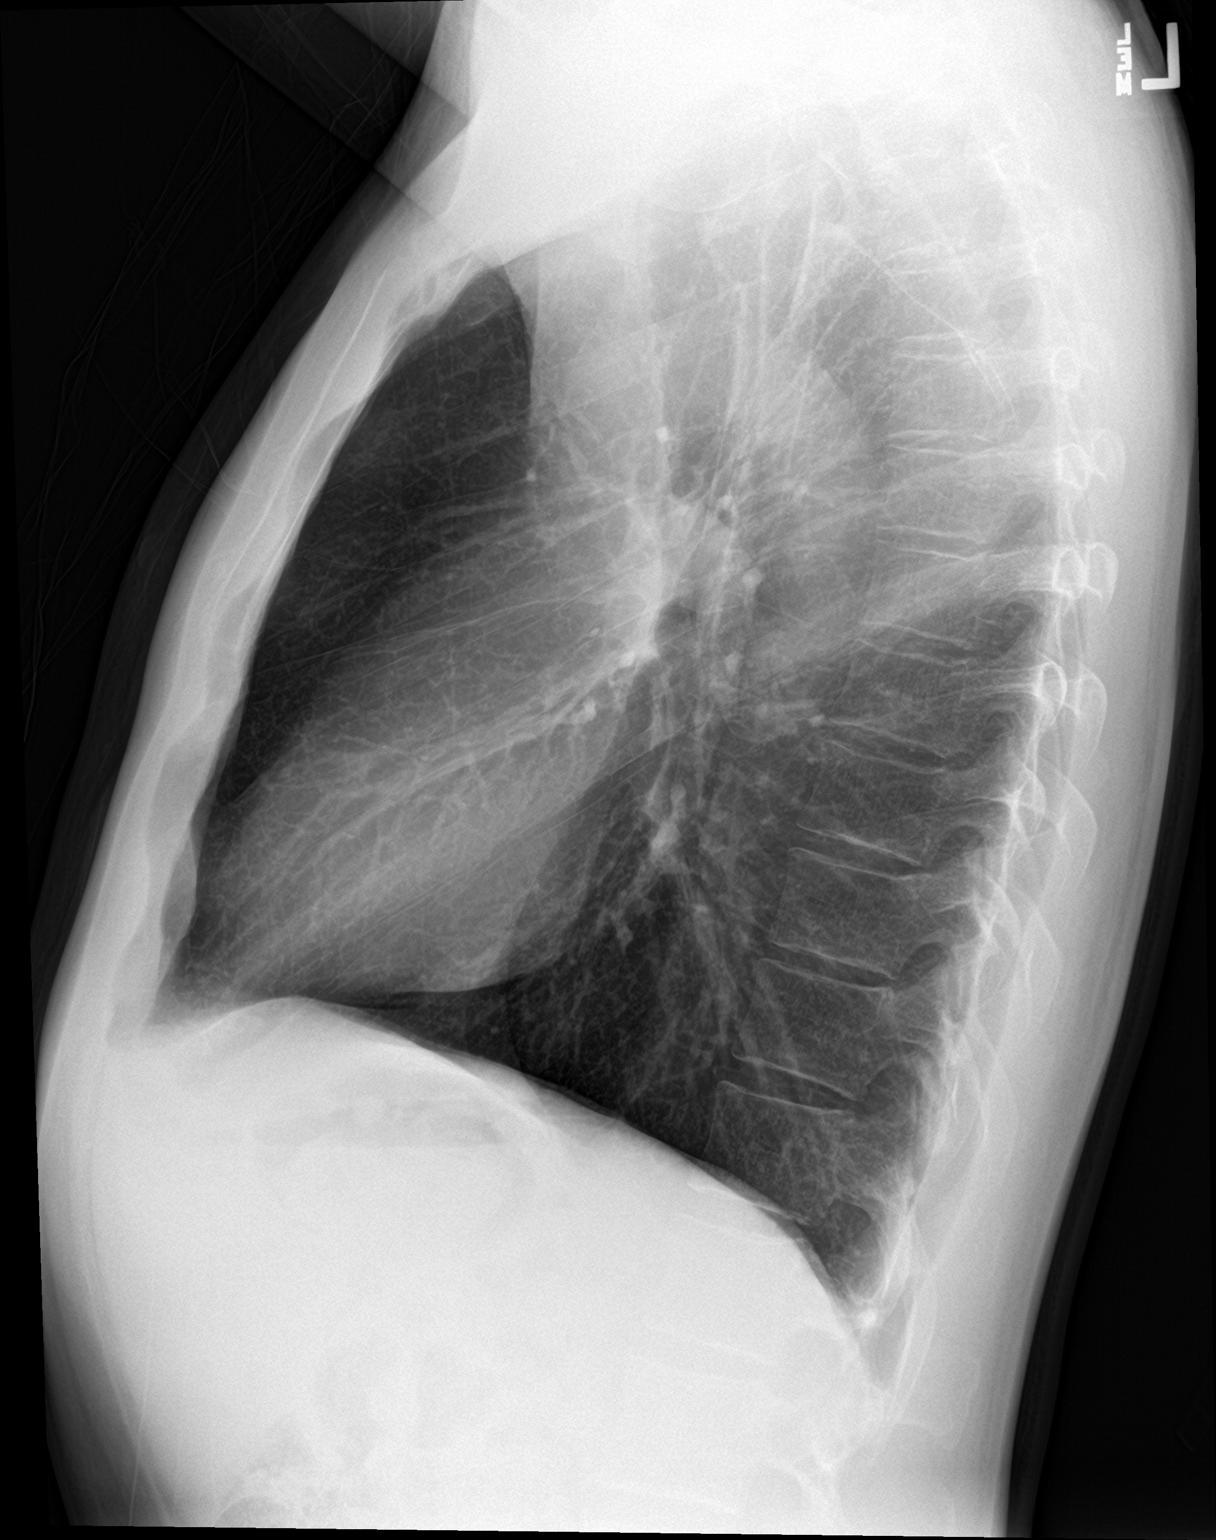

[2 of 2 positions shown; findings below may reference images not displayed]

FINDINGS: The lungs are mildly hyperinflated but clear. The heart and
pulmonary vascularity are normal. The mediastinum is normal in
width. There is no pleural effusion or pneumothorax. The bony thorax
is unremarkable.
IMPRESSION: Hyperinflation may be voluntary or may reflect reactive airway
disease. There is no pneumonia nor other acute cardiopulmonary
abnormality.

## 2020-06-04 ENCOUNTER — Other Ambulatory Visit: Payer: Self-pay | Admitting: Primary Care

## 2020-06-04 DIAGNOSIS — F32A Depression, unspecified: Secondary | ICD-10-CM

## 2020-06-05 NOTE — Telephone Encounter (Signed)
Patient due for CPE/follow-up, will need this for further refills. Please schedule.

## 2020-06-08 NOTE — Telephone Encounter (Signed)
Called patient to schedule appointment for cpe/follow up. LVM to call back and schedule as per DPR.

## 2020-06-11 NOTE — Telephone Encounter (Signed)
Called patient to schedule cpe/ follow up/ LVM to call back. Letter sent.

## 2020-07-07 ENCOUNTER — Telehealth: Payer: Self-pay | Admitting: Primary Care

## 2020-07-07 NOTE — Telephone Encounter (Signed)
Sample placed at reception with patient name and dob for him to pick up today.

## 2020-07-07 NOTE — Telephone Encounter (Signed)
Patient states that his Continuous Blood Gluc Sensor ( Freestyle Libre 14 day sensor) came off his arm today and he is asking for a replacement sooner than his refill that is due. He was wondering if we could send in another order for it to go to [facility] [place]/  He was hoping he could get it today. EM

## 2020-07-07 NOTE — Telephone Encounter (Signed)
Left message to return call to our office.  We have a sample he can come by and pick up.

## 2020-07-07 NOTE — Telephone Encounter (Signed)
Spoke with patient. He will be by here today around 2 to pick it up. He said thank you so much. EM

## 2020-07-09 ENCOUNTER — Other Ambulatory Visit: Payer: Self-pay | Admitting: Primary Care

## 2020-07-09 DIAGNOSIS — F419 Anxiety disorder, unspecified: Secondary | ICD-10-CM

## 2020-07-09 DIAGNOSIS — F32A Depression, unspecified: Secondary | ICD-10-CM

## 2020-08-21 ENCOUNTER — Telehealth: Payer: Self-pay

## 2020-08-21 ENCOUNTER — Encounter: Payer: PRIVATE HEALTH INSURANCE | Admitting: Primary Care

## 2020-08-21 NOTE — Telephone Encounter (Signed)
Patient had CPE appointment today 08/21/20. He has no showed for this appointment. Has had several in past:  Cancelled day of after appointment time 05/01/2020 06/06/2016  No Show  08/21/2020 02/03/2021 07/04/2018 02/26/2018   Do you want me to call to give verbal warning about our no show policy?

## 2020-08-21 NOTE — Telephone Encounter (Signed)
Yes, please kindly notify patient that we are always happy to see him but needs to show up to scheduled appointments. Okay to reschedule CPE. We cannot provide refills until he's seen.

## 2020-08-24 NOTE — Telephone Encounter (Signed)
Left message to return call to our office.  

## 2020-08-30 ENCOUNTER — Encounter: Payer: Self-pay | Admitting: Registered Nurse

## 2020-08-30 ENCOUNTER — Telehealth: Payer: Self-pay | Admitting: Registered Nurse

## 2020-08-30 NOTE — Telephone Encounter (Signed)
Late entry notified 08/26/2020 by HR patient reported parents had covid exposure 08/25/2020.  He is living with parents and all 3 have vaccinations x 3.  He is wearing mask when onsite at work for 10 days and monitoring for symptoms and patient notified Recruitment consultant.  No quarantine or testing at this time unless symptoms develop in parents.  Day 0 08/25/2020  Day 5 08/30/20  Day 10 09/04/20  Message left for patient today checking in to see if any symptoms have developed and continue mask wear through 09/04/2020 at work.

## 2020-09-02 NOTE — Telephone Encounter (Signed)
Patient left message this am [id number] parents and he are asymptomatic all home covid tests negative.

## 2020-09-04 NOTE — Telephone Encounter (Signed)
Called pt as Day 10 to ensure still asymptomatic and close case. No answer. LVMRCB.

## 2020-09-05 NOTE — Telephone Encounter (Signed)
Reviewed RN Hildred Alamin note and will follow up with patient this weekend.

## 2020-09-15 ENCOUNTER — Other Ambulatory Visit: Payer: Self-pay

## 2020-09-15 ENCOUNTER — Ambulatory Visit: Payer: Self-pay | Admitting: Registered Nurse

## 2020-09-15 VITALS — BP 132/84 | HR 103 | Temp 97.1°F

## 2020-09-15 DIAGNOSIS — IMO0002 Reserved for concepts with insufficient information to code with codable children: Secondary | ICD-10-CM

## 2020-09-15 DIAGNOSIS — E1065 Type 1 diabetes mellitus with hyperglycemia: Secondary | ICD-10-CM

## 2020-09-15 DIAGNOSIS — L03314 Cellulitis of groin: Secondary | ICD-10-CM

## 2020-09-15 MED ORDER — DOXYCYCLINE HYCLATE 100 MG PO TABS: 100.0000 mg | ORAL_TABLET | Freq: Two times a day (BID) | ORAL | 0 refills | Status: AC

## 2020-09-15 MED ORDER — MUPIROCIN 2 % EX OINT: TOPICAL_OINTMENT | CUTANEOUS | 0 refills | Status: DC

## 2020-09-15 NOTE — Progress Notes (Signed)
Subjective:    Patient ID: James Stanley, male    DOB: Oct 18, 1980, 40 y.o.   MRN: [id number]  39y/o established male pt c/o cellulitis to L inner thigh near underwear crease. Noticed it appear approx one week ago. Increasing in pain and size. Reports multiple blisters or heads to area. Some drainage from one earlier in the week. None currently. Needs refill on mupirocin ointment ran out.  Putting gauze in underwear and changing prn.  Restarted Hibiclens body wash. Is requesting oral abx and abx cream. Per chart review last doxycycline use Aug 2021.  Patient reported stopped hibiclens in the fall when bottle ran out and didn't restart until he noted new rash this past week.  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  Patient reported blood sugars have been under better control this year.  Has endocrinology appt scheduled.  Hasn't had Hgba1c since last year but continuous glucose monitor averaging 180s compared to 300s last year.     Review of Systems  Constitutional: Negative for activity change, appetite change, chills, diaphoresis, fatigue and fever.  HENT: Negative for trouble swallowing and voice change.   Eyes: Negative for photophobia and visual disturbance.  Respiratory: Negative for cough, choking, chest tightness, shortness of breath, wheezing and stridor.   Cardiovascular: Negative for chest pain.  Gastrointestinal: Negative for diarrhea, nausea and vomiting.  Endocrine: Negative for cold intolerance and heat intolerance.  Genitourinary: Negative for difficulty urinating.  Musculoskeletal: Negative for gait problem, neck pain and neck stiffness.  Skin: Positive for color change, rash and wound. Negative for pallor.  Allergic/Immunologic: Positive for immunocompromised state. Negative for food allergies.  Neurological: Negative for dizziness, tremors, seizures, syncope, facial asymmetry, speech difficulty,  weakness, light-headedness, numbness and headaches.  Hematological: Negative for adenopathy. Does not bruise/bleed easily.  Psychiatric/Behavioral: Negative for agitation, confusion and sleep disturbance.       Objective:   Physical Exam Vitals and nursing note reviewed.  Constitutional:      General: He is awake. He is not in acute distress.    Appearance: Normal appearance. He is well-developed, well-groomed and normal weight. He is not ill-appearing, toxic-appearing or diaphoretic.  HENT:     Head: Normocephalic and atraumatic.     Jaw: There is normal jaw occlusion. No trismus, tenderness, swelling, pain on movement or malocclusion.     Salivary Glands: Right salivary gland is not diffusely enlarged. Left salivary gland is not diffusely enlarged.     Right Ear: Hearing and external ear normal.     Left Ear: Hearing and external ear normal.     Nose: Nose normal.     Mouth/Throat:     Mouth: No angioedema.     Pharynx: Oropharynx is clear.  Eyes:     General: Lids are normal. Vision grossly intact. Gaze aligned appropriately. No allergic shiner, visual field deficit or scleral icterus.       Right eye: No discharge.        Left eye: No discharge.     Extraocular Movements: Extraocular movements intact.     Conjunctiva/sclera: Conjunctivae normal.     Pupils: Pupils are equal, round, and reactive to light.  Neck:     Trachea: Trachea and phonation normal.  Cardiovascular:     Rate and Rhythm: Normal rate and regular rhythm.     Pulses:          Radial pulses are 2+ on the right  side and 2+ on the left side.     Heart sounds: Normal heart sounds.  Pulmonary:     Effort: Pulmonary effort is normal. No respiratory distress.     Breath sounds: Normal breath sounds and air entry. No stridor, decreased air movement or transmitted upper airway sounds. No decreased breath sounds, wheezing, rhonchi or rales.     Comments: No cough observed in exam room; spoke full sentences without  difficulty Abdominal:     General: Abdomen is flat.     Palpations: Abdomen is soft.  Musculoskeletal:        General: Swelling and tenderness present. No deformity or signs of injury. Normal range of motion.     Right shoulder: Normal.     Left shoulder: Normal.     Right elbow: Normal.     Left elbow: Normal.     Right hand: Normal.     Left hand: Normal.     Cervical back: Normal, normal range of motion and neck supple. No swelling, edema, deformity, erythema, signs of trauma, lacerations, rigidity, torticollis or crepitus. No pain with movement. Normal range of motion.     Thoracic back: Normal.     Lumbar back: Normal.     Right hip: Normal.     Left hip: Normal.     Right upper leg: Normal.     Left upper leg: Swelling and tenderness present. No edema, deformity, lacerations or bony tenderness.     Right knee: Normal.     Left knee: Normal.     Right lower leg: No edema.     Left lower leg: No edema.     Right ankle: Normal.     Left ankle: Normal.       Legs:     Comments: Grouped pustules on erythematous base 1.5cm diameter medial left thigh TTP discharge on gauze scant when removed white  Lymphadenopathy:     Head:     Right side of head: No submental, submandibular, tonsillar or preauricular adenopathy.     Left side of head: No submental, submandibular, tonsillar or preauricular adenopathy.     Cervical: No cervical adenopathy.     Right cervical: No superficial cervical adenopathy.    Left cervical: No superficial cervical adenopathy.     Lower Body: No right inguinal adenopathy. No left inguinal adenopathy.  Skin:    General: Skin is warm and dry.     Capillary Refill: Capillary refill takes less than 2 seconds.     Coloration: Skin is not ashen, cyanotic, jaundiced, mottled, pale or sallow.     Findings: Erythema and rash present. No abrasion, abscess, acne, bruising, burn, ecchymosis, signs of injury, laceration, lesion, petechiae or wound. Rash is macular,  papular and pustular. Rash is not crusting, nodular, purpuric, scaling, urticarial or vesicular.     Nails: There is no clubbing.          Comments: Grouped pustules/papules nummular on erythematous base no induration/fluctuance slightly TTP; diameter 1.5cm pustules and 2.5cm erythema  Neurological:     General: No focal deficit present.     Mental Status: He is alert and oriented to person, place, and time. Mental status is at baseline.     GCS: GCS eye subscore is 4. GCS verbal subscore is 5. GCS motor subscore is 6.     Cranial Nerves: Cranial nerves are intact. No cranial nerve deficit, dysarthria or facial asymmetry.     Sensory: Sensation is intact. No sensory deficit.  Motor: Motor function is intact. No weakness, tremor, atrophy, abnormal muscle tone or seizure activity.     Coordination: Coordination is intact. Coordination normal.     Gait: Gait is intact. Gait normal.     Comments: Gait sure and steady in clinic; in/out of chair without difficulty; bilateral hand grasp equal 5/5  Psychiatric:        Attention and Perception: Attention and perception normal.        Mood and Affect: Mood and affect normal.        Speech: Speech normal.        Behavior: Behavior normal. Behavior is cooperative.        Thought Content: Thought content normal.        Cognition and Memory: Cognition normal.        Judgment: Judgment normal.   Results for James Stanley, James Stanley (MRN [id number]) as of 09/15/2020 22:51  Ref. Range 11/08/2019 09:10  COMPREHENSIVE METABOLIC PANEL Unknown Rpt (A)  Sodium Latest Ref Range: 134 - 144 mmol/L 141  Potassium Latest Ref Range: 3.5 - 5.2 mmol/L 4.1  Chloride Latest Ref Range: 96 - 106 mmol/L 101  CO2 Latest Ref Range: 20 - 29 mmol/L 22  Glucose Latest Ref Range: 65 - 99 mg/dL 132 (H)  BUN Latest Ref Range: 6 - 20 mg/dL 20  Creatinine Latest Ref Range: 0.76 - 1.27 mg/dL 0.79  Calcium Latest Ref Range: 8.7 - 10.2 mg/dL 8.9  BUN/Creatinine Ratio Latest Ref Range:  9 - 20  25 (H)  Alkaline Phosphatase Latest Ref Range: 48 - 121 IU/L 112  Albumin Latest Ref Range: 4.0 - 5.0 g/dL 4.5  Albumin/Globulin Ratio Latest Ref Range: 1.2 - 2.2  2.0  AST Latest Ref Range: 0 - 40 IU/L 20  ALT Latest Ref Range: 0 - 44 IU/L 13  Total Protein Latest Ref Range: 6.0 - 8.5 g/dL 6.8  Total Bilirubin Latest Ref Range: 0.0 - 1.2 mg/dL <0.2  GFR, Est Non African American Latest Ref Range: >59 mL/min/1.73 115  GFR, Est African American Latest Ref Range: >59 mL/min/1.73 133   Results for James Stanley, James Stanley (MRN [id number]) as of 09/15/2020 22:51  Ref. Range 12/13/2017 12:13 07/10/2018 09:29 03/21/2019 08:26 04/25/2019 09:35 11/08/2019 09:10  Glucose Latest Ref Range: 65 - 99 mg/dL 267 (H) 207 (H) 207 (H) 60 (L) 132 (H)  Hemoglobin A1C Latest Ref Range: 4.8 - 5.6 %  10.8 (H) 9.0 (H)  9.7 (H)        Assessment & Plan:  A-cellulitis groin initial visit, DM Type I uncontrolled insulin dependent  P-Exitcare handout on skin infectionprinted and given to patient. Discussed possible side effects sunburn while on doxycycline; wear sunscreen/sun protective clothing. Doxycycline 100mg  po BID x 7 days and will re-evaluate in one week may need to take up to 14 days #20 RF0 dispensed from [facility] to patient. Electronic Rx to his pharmacy of choice restart bactroban 2% topical BID #20gm RF0 after washing affected area with soap and water daily until area healed. May apply warm compress daily to help bring circulation to area. Do not puncture affected area or scratch to get to drain; if drains on own let it but do not dig/use needle/scratch as could worsen/spread infection.  Most important is consistent daily skin care regimen/moisterizing and diabetic skin checks daily. Do not apply baby powder to affected area.  Discussed if sweat soaking clothes bring a second set to change into on lunch break.  Encouraged patient to make health  dietary choices, take his diabetic medications and goal to reduce Hgba1c  closer to 7.  Discussed with patient I think elevated blood sugars, sweating/sweat soaked clothes are contributing to his recurrent cellulitis.  Discussed after shower apply moisturizer/emollient and perform skin check daily.  Continue hibiclens shower daily through the summer until temperatures decrease and not sweating as much. RTC if worsening erythema, pain, purulent discharge, feverafter 48 hours on antibiotics. May spread worsen a little over the next 24 hours to be expected but after that should be improving. Wash towels, washcloths, sheets in hot water with bleach every couple of days until infection resolved. Wash area with soap and water at least daily. Avoid scratching. Cover affected area with bandage until healed over.  Given telfa gauze 3x4 10 pads from clinic stock may see RN Hildred Alamin for refill if needed. Patient verbalized understanding, agreed with plan of care and had no further questions at this time.   Keep follow up with [facility] and endocrinology.  Patient using continuous glucose monitoring and making healthy dietary choices per patient.  Overdue for Hgba1c discussed recommended every 3 months until blood sugar under better control 120s spot glucose and Hgba1c 7 or less.  Avoid dehydration.  Plan 7-8 hours sleep per night.  Patient verbalized understanding information/instructions, agreed with plan of care and had no further questions at this time.

## 2020-09-15 NOTE — Telephone Encounter (Signed)
Reviewed RN Hildred Alamin note patient tested 3 times and all tests negative.  Closing encounter.  Patient seen in clinic today for unrelated concern see office visit note.

## 2020-09-15 NOTE — Telephone Encounter (Signed)
Pt returned phone calls with email reporting that he and parents all remained asymptomatic and that he tested 3 times in the week he was out and all were negative.

## 2020-09-15 NOTE — Telephone Encounter (Signed)
LVM on cell phone. Tried home number but it says the number cannot be completed as dialed.

## 2020-09-15 NOTE — Patient Instructions (Signed)
USE HIBICLENS DAILY [place] UNTIL WINTER START DOXYCYCLINE 100MG  BY MOUTH EVERY 12 HOURS FOR 7 DAYS FOLLOW UP IF NOT COMPLETELY RESOLVED FOR RE-EVALUATION TELFA GAUZE CHANGE PRN; DO NOT STAY IN SWEAT SOAKED CLOTHES APPLY MUPIROCIN TWICE A DAY UNTIL HEALED  Cellulitis, Adult  Cellulitis is a skin infection. The infected area is usually warm, red, swollen, and tender. This condition occurs most often in the arms and lower legs. The infection can travel to the muscles, blood, and underlying tissue and become serious. It is very important to get treated for this condition. What are the causes? Cellulitis is caused by bacteria. The bacteria enter through a break in the skin, such as a cut, burn, insect bite, open sore, or crack. What increases the risk? This condition is more likely to occur in people who:  Have a weak body defense system (immune system).  Have open wounds on the skin, such as cuts, burns, bites, and scrapes. Bacteria can enter the body through these open wounds.  Are older than 39 years of age.  Have diabetes.  Have a type of long-lasting (chronic) liver disease (cirrhosis) or kidney disease.  Are obese.  Have a skin condition such as: ? Itchy rash (eczema). ? Slow movement of blood in the veins (venous stasis). ? Fluid buildup below the skin (edema).  Have had radiation therapy.  Use IV drugs. What are the signs or symptoms? Symptoms of this condition include:  Redness, streaking, or spotting on the skin.  Swollen area of the skin.  Tenderness or pain when an area of the skin is touched.  Warm skin.  A fever.  Chills.  Blisters. How is this diagnosed? This condition is diagnosed based on a medical history and physical exam. You may also have tests, including:  Blood tests.  Imaging tests. How is this treated? Treatment for this condition may include:  Medicines, such as antibiotic medicines or medicines to treat allergies  (antihistamines).  Supportive care, such as rest and application of cold or warm cloths (compresses) to the skin.  Hospital care, if the condition is severe. The infection usually starts to get better within 1-2 days of treatment. Follow these instructions at home: Medicines  Take over-the-counter and prescription medicines only as told by your health care provider.  If you were prescribed an antibiotic medicine, take it as told by your health care provider. Do not stop taking the antibiotic even if you start to feel better. General instructions  Drink enough fluid to keep your urine pale yellow.  Do not touch or rub the infected area.  Raise (elevate) the infected area above the level of your heart while you are sitting or lying down.  Apply warm or cold compresses to the affected area as told by your health care provider.  Keep all follow-up visits as told by your health care provider. This is important. These visits let your health care provider make sure a more serious infection is not developing.   Contact a health care provider if:  You have a fever.  Your symptoms do not begin to improve within 1-2 days of starting treatment.  Your bone or joint underneath the infected area becomes painful after the skin has healed.  Your infection returns in the same area or another area.  You notice a swollen bump in the infected area.  You develop new symptoms.  You have a general ill feeling (malaise) with muscle aches and pains. Get help right away if:  Your symptoms get  worse.  You feel very sleepy.  You develop vomiting or diarrhea that persists.  You notice red streaks coming from the infected area.  Your red area gets larger or turns dark in color. These symptoms may represent a serious problem that is an emergency. Do not wait to see if the symptoms will go away. Get medical help right away. Call your local emergency services (911 in the [place] Do not drive yourself  to the hospital. Summary  Cellulitis is a skin infection. This condition occurs most often in the arms and lower legs.  Treatment for this condition may include medicines, such as antibiotic medicines or antihistamines.  Take over-the-counter and prescription medicines only as told by your health care provider. If you were prescribed an antibiotic medicine, do not stop taking the antibiotic even if you start to feel better.  Contact a health care provider if your symptoms do not begin to improve within 1-2 days of starting treatment or your symptoms get worse.  Keep all follow-up visits as told by your health care provider. This is important. These visits let your health care provider make sure that a more serious infection is not developing. This information is not intended to replace advice given to you by your health care provider. Make sure you discuss any questions you have with your health care provider. Document Revised: 04/08/2019 Document Reviewed: 08/17/2017 Elsevier Patient Education  [place].

## 2020-09-21 ENCOUNTER — Encounter: Payer: Self-pay | Admitting: *Deleted

## 2020-09-21 ENCOUNTER — Telehealth: Payer: Self-pay | Admitting: *Deleted

## 2020-09-21 NOTE — Telephone Encounter (Signed)
LVM

## 2020-09-21 NOTE — Telephone Encounter (Signed)
Called pt earlier today with no answer. Pt returned call.   Reports he has been having n/v/d related to high blood sugars since yesterday. Abd cramping just prior to diarrhea episodes. Pain is not constant. CBGs were running in the 400's yesterday/overnight. Down to 200's this morning, now 300's currently. Pushing fluids as he typically does when his sugar is elevated. Able to tolerate fluids.   Plan for covid testing as precaution on day 3 Wed 6/15 0930. Rapid NAAT test for possible personal medical clearance to RTW 6/16 if test negative, all sx resolved with sugar getting under control. Pt reports feeling improved from last night and denies current needs or concerns. Is aware not to report to work until he receives clearance from clinic.

## 2020-09-21 NOTE — Telephone Encounter (Signed)
Patient with cellulitis 6/7 office visit started on doxycycline having GI upset and elevated blood sugars will follow up tomorrow virtual visit with patient.  Tolerating po intake today.  Patient overdue for Hgba1c/follow up with endocrinology for type 1 diabetes.  Reviewed RN Hildred Alamin note and agreed with plan of care.

## 2020-09-22 NOTE — Telephone Encounter (Signed)
Call for sx check. No answer. LVMRCB.

## 2020-09-22 NOTE — Telephone Encounter (Signed)
Pt returned call and left VM. Reports all GI sx resolved. Sugar has returned to "green level" on monitor. He is going for testing tomorrow 6/15 at 940 at [facility]. Plan for f/u after this 2hr rapid NAAT test to determine return to work.

## 2020-09-23 NOTE — Telephone Encounter (Signed)
Spoke with patient via telephone feeling better n/v/d resolved and blood sugars back in the green on his continuous glucose monitor.  Patient ready to return to work.  Discussed maintaining hydration during these 100+ degree heat warning days, regular meals and monitor for symptoms of heat illness.  Patient reported rash almost completed resolved and area with new tissue granulation that was previously draining.  Denied fever/chills/muscle aches.  Will notify HR cleared to return onsite 09/24/20.  Patient A&Ox3 spoke full sentences without difficulty no nasal congestion/throat clearing/cough during 3 minute telephone call.  Patient to wear mask if any URI symptoms.  Notes usual nasal congestion in ams "my allergies" but no change in duration/volume congestion/post nasal drip.  Patient to notify clinic staff if new or worsening symptoms.  Discussed community transmission of covid [place] and moderate [place] per [employer].  Patient verbalized understanding information/instructions, agreed with plan of care and had no further questions at this time.

## 2020-09-23 NOTE — Telephone Encounter (Signed)
Noted  

## 2020-09-23 NOTE — Telephone Encounter (Signed)
Reviewed RN Hildred Alamin note; blood sugars improved and symptoms resolved.  Covid testing results pending.

## 2020-09-23 NOTE — Telephone Encounter (Signed)
Patient notified HR covid test results negative and sent copy of results to HR which they forwarded to clinic staff.

## 2020-09-23 NOTE — Telephone Encounter (Signed)
Contacted pt and advised of NS apts in the past and importance of keeping apts and if not able to cancel apts at least 24 hours before apt. Pt apologized and advised to tell PCP he apologized. Advised if the pattern of NS continued he could be dismissed from the practice. Asked if pt would like to RS CPE. He said he will call when he is at work so he can look at his daily planner. Gave pt clinic phone number. Advised if anything else is needed to contact office. Pt verbalized understanding.

## 2020-09-25 NOTE — Telephone Encounter (Signed)
Per supervisor, pt did RTW yesterday 6/16 as expected.

## 2020-09-27 NOTE — Telephone Encounter (Signed)
Noted patient returned to work 6/16 as expected and Day 10 6/22 patient wearing mask at work due to [employer] transmission rate high and employer requiring mask wear indoors.

## 2020-09-28 ENCOUNTER — Other Ambulatory Visit: Payer: Self-pay | Admitting: Primary Care

## 2020-09-28 DIAGNOSIS — F419 Anxiety disorder, unspecified: Secondary | ICD-10-CM

## 2020-09-28 DIAGNOSIS — F32A Depression, unspecified: Secondary | ICD-10-CM

## 2020-10-08 NOTE — Telephone Encounter (Signed)
James Stanley, FYI 

## 2020-10-08 NOTE — Telephone Encounter (Signed)
He has an apt on 7/27 @3  for CPE

## 2020-10-13 NOTE — Telephone Encounter (Signed)
Mr. James Stanley called in wanted to know if he can get at least enough to get him to his appointment.

## 2020-10-14 ENCOUNTER — Other Ambulatory Visit: Payer: Self-pay | Admitting: Primary Care

## 2020-10-14 DIAGNOSIS — F32A Depression, unspecified: Secondary | ICD-10-CM

## 2020-10-14 MED ORDER — VENLAFAXINE HCL ER 150 MG PO CP24: 150.0000 mg | ORAL_CAPSULE | Freq: Every day | ORAL | 0 refills | Status: DC

## 2020-10-14 NOTE — Addendum Note (Signed)
Addended by: Pleas Koch on: 10/14/2020 05:09 PM   Modules accepted: Orders

## 2020-10-14 NOTE — Telephone Encounter (Addendum)
Which medication is he referring to? Venlafaxine? I will send in enough to get him through. He MUST be seen for further refills.

## 2020-10-14 NOTE — Telephone Encounter (Signed)
Left message to return call to our office.  

## 2020-10-14 NOTE — Telephone Encounter (Signed)
Refills sent to pharmacy. 

## 2020-10-14 NOTE — Addendum Note (Signed)
Addended by: Kris Mouton on: 10/14/2020 04:21 PM   Modules accepted: Orders

## 2020-10-14 NOTE — Telephone Encounter (Signed)
Patient called back stating that he was talking about Venlafaxine. He is aware to keep his appointment on 11/04/20

## 2020-10-20 ENCOUNTER — Telehealth: Payer: Self-pay | Admitting: *Deleted

## 2020-10-20 NOTE — Telephone Encounter (Signed)
Clinic notified by HR that pt called out of work Monday citing n/v/d r/t his DM2 all weekend. He also started having a cough with chest tightness yesterday. Spoke with pt by phone. He reports his sugars are becoming more controlled and n/v/d have resolved. Sts cough is dry but chest feels like it is becoming congested and worsening today. He plans to start an expectorant tonight. Advised to avoid this 2 hours before bed and use a cough suppressant if needed at night. Has been pushing fluids. Goes to pcp 7/27 and will have labs done there and from there is going to make an appt with endocrinology to discuss getting set up with Dexcom Omni insulin pump and CGM system.   Day 0 7/11 Day 5 7/16 Testing Day 3 [facility] PCR 7/14 Tentative RTW 7/18 Day 10 7/21

## 2020-10-21 ENCOUNTER — Encounter: Payer: Self-pay | Admitting: *Deleted

## 2020-10-21 NOTE — Telephone Encounter (Signed)
RN Hildred Alamin discussed patient case with me in clinic earlier today when I was onsite.  Agreed with plan of care and covid testing due to moderate transmission level in local community.  Reviewed RN Hildred Alamin note.

## 2020-10-22 NOTE — Telephone Encounter (Signed)
Reviewed RN Hildred Alamin note patient no answer today will try again tomorrow.

## 2020-10-22 NOTE — Telephone Encounter (Signed)
Called for sx check and confirm test done today. No answer. LVMRCB or to send email to [facility] address if any questions.

## 2020-10-23 NOTE — Telephone Encounter (Signed)
No answer 7/15. LVMRCB.

## 2020-10-24 NOTE — Telephone Encounter (Signed)
See tcon dated 10/20/20 patient reported rash resolved along with symptoms for this episode of symptoms prior to experiencing hyperglycemia symptoms again earlier this week.

## 2020-10-24 NOTE — Telephone Encounter (Signed)
Patient contacted via telephone and he stated all symptoms resolved 10/22/20 feeling well.   Patient reported his rash had resolved a few weeks earlier also. Was unable to go to [facility] for test as scheduled so rescheduled with [facility] today and hoping to having results back tomorrow but was told 1-2 days.  Patient A&Ox3 spoke full sentences without difficulty, no cough/congestion/nasal or throat clearing noted during 3 minute telephone call.  HR notified test results pending and patient to call when when he receives results at [phone].

## 2020-10-25 NOTE — Telephone Encounter (Signed)
Patient contacted via telephone still awaiting his test results.  Noted if receives overnight can email me [email or web address] and go into work with mask wear through 10/29/20.  Patient A&Ox3 spoke full sentences without difficulty no cough/congestion/throat clearing noted during 2 minute telephone call.  HR notified results still pending but if negative cleared to return onsite tomorrow 7/18 with strict mask wear through 10/29/20  Patient verbalized understanding information/instructions, agreed with plan of care and had no further questions at this time.

## 2020-10-26 NOTE — Telephone Encounter (Signed)
Reviewed RN Hildred Alamin note, negative covid test cleared to return onsite tomorrow.  Agreed with plan of care strict mask use through 10/29/20 and no eating in employee lunch room.

## 2020-10-26 NOTE — Telephone Encounter (Signed)
Spoke with pt by phone. He reports negative result received around 1pm today. RTW tomorrow 7/19 with strict mask use thru 7/21. He feels well. No sx. Closing encounter.

## 2020-10-28 NOTE — Telephone Encounter (Signed)
Patient contacted via telephone stated returned to work 10/27/20 as expected.  Has been putting no sugar added gatorade packets in his water to drink at work.  All symptoms resolved and wearing mask through 10/29/20 as instructed and no eating in employee lunch room.  Symptoms all resolved.  Discussed avoid dehydration with heat index over 100 this week.  HR notified returned to work as expected case closed Day 10 7/21.  Patient A&Ox3 spoke full sentences without difficulty no cough/congestion or throat clearing noted during 2 minute telephone call.

## 2020-11-04 ENCOUNTER — Other Ambulatory Visit: Payer: Self-pay

## 2020-11-04 ENCOUNTER — Encounter: Payer: Self-pay | Admitting: Primary Care

## 2020-11-04 ENCOUNTER — Ambulatory Visit: Payer: No Typology Code available for payment source | Admitting: Primary Care

## 2020-11-04 VITALS — BP 136/82 | HR 104 | Temp 97.7°F | Ht 64.5 in | Wt 129.0 lb

## 2020-11-04 DIAGNOSIS — Z0000 Encounter for general adult medical examination without abnormal findings: Secondary | ICD-10-CM

## 2020-11-04 DIAGNOSIS — E785 Hyperlipidemia, unspecified: Secondary | ICD-10-CM | POA: Diagnosis not present

## 2020-11-04 DIAGNOSIS — IMO0002 Reserved for concepts with insufficient information to code with codable children: Secondary | ICD-10-CM

## 2020-11-04 DIAGNOSIS — I10 Essential (primary) hypertension: Secondary | ICD-10-CM | POA: Diagnosis not present

## 2020-11-04 DIAGNOSIS — F419 Anxiety disorder, unspecified: Secondary | ICD-10-CM

## 2020-11-04 DIAGNOSIS — Z7251 High risk heterosexual behavior: Secondary | ICD-10-CM

## 2020-11-04 DIAGNOSIS — F32A Depression, unspecified: Secondary | ICD-10-CM

## 2020-11-04 DIAGNOSIS — E1065 Type 1 diabetes mellitus with hyperglycemia: Secondary | ICD-10-CM

## 2020-11-04 MED ORDER — LISINOPRIL 2.5 MG PO TABS: 2.5000 mg | ORAL_TABLET | Freq: Every day | ORAL | 3 refills | Status: DC

## 2020-11-04 MED ORDER — VENLAFAXINE HCL ER 150 MG PO CP24: 150.0000 mg | ORAL_CAPSULE | Freq: Every day | ORAL | 3 refills | Status: DC

## 2020-11-04 MED ORDER — ATORVASTATIN CALCIUM 40 MG PO TABS: 40.0000 mg | ORAL_TABLET | Freq: Every evening | ORAL | 3 refills | Status: DC

## 2020-11-04 MED ORDER — INSULIN LISPRO 100 UNIT/ML IJ SOLN: INTRAMUSCULAR | 1 refills | Status: DC

## 2020-11-04 NOTE — Assessment & Plan Note (Signed)
Doing well on venlafaxine ER 150 mg, continue same.  Refills provided.

## 2020-11-04 NOTE — Assessment & Plan Note (Signed)
No longer on lisinopril 2.5 mg as he stopped over one year ago. Will send refills today. He agrees to resume.   Labs pending.

## 2020-11-04 NOTE — Progress Notes (Signed)
Subjective:    Patient ID: James Stanley, male    DOB: December 28, 1980, 40 y.o.   MRN: [id number]  HPI  James Stanley is a very pleasant 40 y.o. male who presents today for complete physical and follow up of chronic conditions. He has not been seen by me since March 2021.  He follows with endocrinology, has not been in over one year. He is managed on Humalog with each meal and is injecting 7-10 units. He has the freestyle libre and is monitoring glucose levels regularly. Glucose readings are mostly ranging 250's. He is needing a refill of his Humalog insulin.   Immunizations: -Tetanus: 2015 -Influenza: Due this season  -Covid-19: Completed three vaccines -Pneumonia: Never completed   Diet: Fair diet.  Exercise: No regular exercise.  Eye exam: Completes annually  Dental exam: Completes semi-annually   BP Readings from Last 3 Encounters:  11/04/20 136/82  09/15/20 132/84  11/26/19 118/84       Review of Systems  Constitutional:  Negative for unexpected weight change.  HENT:  Negative for rhinorrhea.   Respiratory:  Negative for shortness of breath.   Cardiovascular:  Negative for chest pain.  Gastrointestinal:  Negative for constipation and diarrhea.  Genitourinary:  Negative for difficulty urinating.  Musculoskeletal:  Negative for arthralgias and myalgias.  Skin:  Negative for rash.  Allergic/Immunologic: Negative for environmental allergies.  Neurological:  Negative for dizziness and headaches.  Psychiatric/Behavioral:  The patient is not nervous/anxious.         Past Medical History:  Diagnosis Date   Anxiety    Chickenpox    Depression    Diabetes mellitus without complication ([place])    Frequent headaches    Hypertension    Migraines     Social History   Socioeconomic History   Marital status: Single    Spouse name: Not on file   Number of children: Not on file   Years of education: Not on file   Highest education level: Not on file  Occupational  History   Not on file  Tobacco Use   Smoking status: Never   Smokeless tobacco: Never  Substance and Sexual Activity   Alcohol use: No   Drug use: Not on file   Sexual activity: Not on file  Other Topics Concern   Not on file  Social History Narrative   Single.   Works at [employer].   Moved from [place].   Enjoys reading.   Social Determinants of Health   Financial Resource Strain: Not on file  Food Insecurity: Not on file  Transportation Needs: Not on file  Physical Activity: Not on file  Stress: Not on file  Social Connections: Not on file  Intimate Partner Violence: Not on file    History reviewed. No pertinent surgical history.  Family History  Problem Relation Age of Onset   Hyperlipidemia Mother    Mental illness Mother    Hypertension Father    Diabetes Father    Mental illness Father    Hyperlipidemia Maternal Grandmother    Heart disease Paternal Grandfather    Hypertension Paternal Grandfather    Testicular cancer Paternal Grandfather     No Known Allergies  Current Outpatient Medications on File Prior to Visit  Medication Sig Dispense Refill   Continuous Blood Gluc Receiver (FREESTYLE LIBRE 14 DAY READER) DEVI Inject 1 Device into the skin daily. 1 Device 0   Continuous Blood Gluc Sensor (FREESTYLE LIBRE 14 DAY SENSOR) MISC USE DAILY AS  DIRECTED TO CHECK BLOOD SUGARS 100 each 1   mineral oil-hydrophilic petrolatum (AQUAPHOR) ointment Apply topically daily. 420 g 0   mupirocin ointment (BACTROBAN) 2 % APPLY TO AFFECTED AREA TWICE A DAY UNTIL HEALED 22 g 0   No current facility-administered medications on file prior to visit.    BP 136/82   Pulse (!) 104   Temp 97.7 F (36.5 C) (Temporal)   Ht 5' 4.5" (1.638 m)   Wt 129 lb (58.5 kg)   SpO2 98%   BMI 21.80 kg/m  Objective:   Physical Exam HENT:     Right Ear: Tympanic membrane and ear canal normal.     Left Ear: Tympanic membrane and ear canal normal.     Nose: Nose normal.      Right Sinus: No maxillary sinus tenderness or frontal sinus tenderness.     Left Sinus: No maxillary sinus tenderness or frontal sinus tenderness.  Eyes:     Conjunctiva/sclera: Conjunctivae normal.  Neck:     Thyroid: No thyromegaly.     Vascular: No carotid bruit.  Cardiovascular:     Rate and Rhythm: Normal rate and regular rhythm.     Heart sounds: Normal heart sounds.  Pulmonary:     Effort: Pulmonary effort is normal.     Breath sounds: Normal breath sounds. No wheezing or rales.  Abdominal:     General: Bowel sounds are normal.     Palpations: Abdomen is soft.     Tenderness: There is no abdominal tenderness.  Musculoskeletal:        General: Normal range of motion.     Cervical back: Neck supple.  Skin:    General: Skin is warm and dry.  Neurological:     Mental Status: He is alert and oriented to person, place, and time.     Cranial Nerves: No cranial nerve deficit.     Deep Tendon Reflexes: Reflexes are normal and symmetric.  Psychiatric:        Mood and Affect: Mood normal.          Assessment & Plan:      This visit occurred during the SARS-CoV-2 public health emergency.  Safety protocols were in place, including screening questions prior to the visit, additional usage of staff PPE, and extensive cleaning of exam room while observing appropriate contact time as indicated for disinfecting solutions.

## 2020-11-04 NOTE — Assessment & Plan Note (Signed)
Non compliant to atorvastatin 40 mg.  Refills provided today. Lipid panel pending.

## 2020-11-04 NOTE — Assessment & Plan Note (Signed)
No longer following with infectious disease but he does plan on resuming.

## 2020-11-04 NOTE — Assessment & Plan Note (Addendum)
No endocrinology follow up in over one year, stressed the importance of regular type 1 diabetes follow up. New referral placed.  Agree to refill insulin until he can get connected.    Will refill lisinopril 2.5 mg. Will refill atorvastatin 40 mg. He will schedule his eye exam.

## 2020-11-04 NOTE — Assessment & Plan Note (Signed)
Immunizations UTD. Discussed pneumonia vaccine.  Discussed the importance of a healthy diet and regular exercise in order for weight loss, and to reduce the risk of further co-morbidity.  Exam today stable. Labs pending.

## 2020-11-04 NOTE — Patient Instructions (Signed)
Stop by the lab prior to leaving today. I will notify you of your results once received.   You will be contacted regarding your referral to endocrinology.  Please let us know if you have not been contacted within two weeks.   Set up a lab appointment for 2 months to repeat labs.   It was a pleasure to see you today!  Preventive Care 5-39 Years Old, Male Preventive care refers to lifestyle choices and visits with your health care provider that can promote health and wellness. This includes: A yearly physical exam. This is also called an annual wellness visit. Regular dental and eye exams. Immunizations. Screening for certain conditions. Healthy lifestyle choices, such as: Eating a healthy diet. Getting regular exercise. Not using drugs or products that contain nicotine and tobacco. Limiting alcohol use. What can I expect for my preventive care visit? Physical exam Your health care provider may check your: Height and weight. These may be used to calculate your BMI (body mass index). BMI is a measurement that tells if you are at a healthy weight. Heart rate and blood pressure. Body temperature. Skin for abnormal spots. Counseling Your health care provider may ask you questions about your: Past medical problems. Family's medical history. Alcohol, tobacco, and drug use. Emotional well-being. Home life and relationship well-being. Sexual activity. Diet, exercise, and sleep habits. Work and work Statistician. Access to firearms. What immunizations do I need?  Vaccines are usually given at various ages, according to a schedule. Your health care provider will recommend vaccines for you based on your age, medicalhistory, and lifestyle or other factors, such as travel or where you work. What tests do I need? Blood tests Lipid and cholesterol levels. These may be checked every 5 years starting at age 88. Hepatitis C test. Hepatitis B test. Screening  Diabetes screening. This is  done by checking your blood sugar (glucose) after you have not eaten for a while (fasting). Genital exam to check for testicular cancer or hernias. STD (sexually transmitted disease) testing, if you are at risk. Talk with your health care provider about your test results, treatment options,and if necessary, the need for more tests. Follow these instructions at home: Eating and drinking  Eat a healthy diet that includes fresh fruits and vegetables, whole grains, lean protein, and low-fat dairy products. Drink enough fluid to keep your urine pale yellow. Take vitamin and mineral supplements as recommended by your health care provider. Do not drink alcohol if your health care provider tells you not to drink. If you drink alcohol: Limit how much you have to 0-2 drinks a day. Be aware of how much alcohol is in your drink. In the [place] one drink equals one 12 oz bottle of beer (355 mL), one 5 oz glass of wine (148 mL), or one 1 oz glass of hard liquor (44 mL).  Lifestyle Take daily care of your teeth and gums. Brush your teeth every morning and night with fluoride toothpaste. Floss one time each day. Stay active. Exercise for at least 30 minutes 5 or more days each week. Do not use any products that contain nicotine or tobacco, such as cigarettes, e-cigarettes, and chewing tobacco. If you need help quitting, ask your health care provider. Do not use drugs. If you are sexually active, practice safe sex. Use a condom or other form of protection to prevent STIs (sexually transmitted infections). Find healthy ways to cope with stress, such as: Meditation, yoga, or listening to music. Journaling. Talking to a  trusted person. Spending time with friends and family. Safety Always wear your seat belt while driving or riding in a vehicle. Do not drive: If you have been drinking alcohol. Do not ride with someone who has been drinking. When you are tired or distracted. While texting. Wear a helmet and  other protective equipment during sports activities. If you have firearms in your house, make sure you follow all gun safety procedures. Seek help if you have been physically or sexually abused. What's next? Go to your health care provider once a year for an annual wellness visit. Ask your health care provider how often you should have your eyes and teeth checked. Stay up to date on all vaccines. This information is not intended to replace advice given to you by your health care provider. Make sure you discuss any questions you have with your healthcare provider. Document Revised: 12/12/2018 Document Reviewed: 03/22/2018 Elsevier Patient Education  2022 [employer].

## 2020-11-05 ENCOUNTER — Telehealth: Payer: Self-pay | Admitting: Primary Care

## 2020-11-05 LAB — CBC
HCT: 39.2 % (ref 39.0–52.0)
Hemoglobin: 13.4 g/dL (ref 13.0–17.0)
MCHC: 34.2 g/dL (ref 30.0–36.0)
MCV: 93.4 fl (ref 78.0–100.0)
Platelets: 290 10*3/uL (ref 150.0–400.0)
RBC: 4.19 Mil/uL — ABNORMAL LOW (ref 4.22–5.81)
RDW: 12.8 % (ref 11.5–15.5)
WBC: 7.1 10*3/uL (ref 4.0–10.5)

## 2020-11-05 LAB — COMPREHENSIVE METABOLIC PANEL
ALT: 17 U/L (ref 0–53)
AST: 24 U/L (ref 0–37)
Albumin: 4.3 g/dL (ref 3.5–5.2)
Alkaline Phosphatase: 77 U/L (ref 39–117)
BUN: 19 mg/dL (ref 6–23)
CO2: 28 mEq/L (ref 19–32)
Calcium: 8.6 mg/dL (ref 8.4–10.5)
Chloride: 95 mEq/L — ABNORMAL LOW (ref 96–112)
Creatinine, Ser: 0.84 mg/dL (ref 0.40–1.50)
GFR: 110.36 mL/min (ref >60.00)
Glucose, Bld: 183 mg/dL — ABNORMAL HIGH (ref 70–99)
Potassium: 4 mEq/L (ref 3.5–5.1)
Sodium: 134 mEq/L — ABNORMAL LOW (ref 135–145)
Total Bilirubin: 0.5 mg/dL (ref 0.2–1.2)
Total Protein: 6.7 g/dL (ref 6.0–8.3)

## 2020-11-05 LAB — LIPID PANEL
Cholesterol: 226 mg/dL — ABNORMAL HIGH (ref 0–200)
HDL: 99 mg/dL (ref >39.00)
LDL Cholesterol: 113 mg/dL — ABNORMAL HIGH (ref 0–99)
NonHDL: 127.19
Total CHOL/HDL Ratio: 2
Triglycerides: 69 mg/dL (ref 0.0–149.0)
VLDL: 13.8 mg/dL (ref 0.0–40.0)

## 2020-11-05 LAB — HEMOGLOBIN A1C: Hgb A1c MFr Bld: 12.1 % — ABNORMAL HIGH (ref 4.6–6.5)

## 2020-11-05 NOTE — Telephone Encounter (Signed)
Lvm asking pt to call office. If he calls back, please transfer to front office supervisor.

## 2020-12-03 ENCOUNTER — Telehealth: Payer: Self-pay | Admitting: Registered Nurse

## 2020-12-03 ENCOUNTER — Encounter: Payer: Self-pay | Admitting: Registered Nurse

## 2020-12-03 NOTE — Telephone Encounter (Signed)
Reviewed work injury form left shin abrasion 12/02/20

## 2020-12-04 NOTE — Telephone Encounter (Signed)
Patient contacted via telephone and reported just a scrape on shin no signs of infection e.g. not red or draining.  Discussed with patient to contact me [phone] if red streaks, hot to touch/swelling or purulent discharge this weekend.  Patient verbalized understanding information/instructions, agreed with plan of care and had no further questions at this time.

## 2020-12-11 ENCOUNTER — Telehealth: Payer: Self-pay | Admitting: *Deleted

## 2020-12-11 NOTE — Telephone Encounter (Signed)
Pt reported to clinic that his father tested positive for Covid today. He lives in the same household with father. They are going to start quarantining separate today. Advised pt of mask use in common areas and frequent cleaning of high touch surfaces. Pt is asymptomatic.  Day 0 12/11/20 Testing, home test okay, Day 5 12/16/20. If negative continue monitoring and strict mask use thru 9/12. If positive, do not report to work and contact clinic or [employer].

## 2020-12-12 ENCOUNTER — Encounter: Payer: Self-pay | Admitting: *Deleted

## 2020-12-12 NOTE — Telephone Encounter (Signed)
Reviewed RN Hildred Alamin note agreed with plan of care.  Will follow up with patient Monday 12/14/20.

## 2020-12-15 NOTE — Telephone Encounter (Signed)
Reviewed RN Hildred Alamin note patient continues asymptomatic and home covid test planned tomorrow will follow up with patient via telephone for results.

## 2020-12-15 NOTE — Telephone Encounter (Signed)
Spoke with pt by phone. He reports he remains asymptomatic. He and his mother continuing to quarantine separate from father and both tested negative on Day 2 9/4. Father now testing negative as of today.  Advised pt to retest tomorrow Day 5 9/7. If negative, continue reporting to work with strict mask use thru day 10 9/12. If positive, stay home and contact clinic staff or [employer].  Pt verbalizes agreement and understanding. Denies further questions or concerns.

## 2020-12-17 NOTE — Telephone Encounter (Signed)
No answer to call or page. VM full. But did talk to pt in clinic this morning. He presented to clinic to discuss a slow healing wound on his L shin. Evaluation and recommendations for this discussed. Covered with neosporin at work, open to air at home, wash and change dressing daily. Failed to discuss testing with him at that time and unable to reach him again but pt was asymptomatic and reported feeling well, no concerns. Will attempt to contact again tomorrow.

## 2020-12-17 NOTE — Telephone Encounter (Signed)
Noted follow up with patient again tomorrow for covid home test results

## 2020-12-19 NOTE — Telephone Encounter (Signed)
Called pt for home test results from 9/7. No answer. VM full. Unable to leave mesg.

## 2020-12-23 NOTE — Telephone Encounter (Signed)
Patient contacted via telephone continues asymptomatic.  Mother did not get ill either and father now testing negative/symptoms resolved.  Patient day day 12 Sep 22 no longer needs to wear mask strict at work.  Patient verbalized understanding information/instructions, agreed with plan of care and had no further questions at this time.  HR notified asymptomatic/negative test Day 10 completed.  Closing encounter.

## 2020-12-27 ENCOUNTER — Other Ambulatory Visit: Payer: Self-pay | Admitting: Primary Care

## 2020-12-27 DIAGNOSIS — E785 Hyperlipidemia, unspecified: Secondary | ICD-10-CM

## 2021-01-04 ENCOUNTER — Telehealth: Payer: Self-pay | Admitting: *Deleted

## 2021-01-04 DIAGNOSIS — U071 COVID-19: Secondary | ICD-10-CM

## 2021-01-04 NOTE — Telephone Encounter (Signed)
[employer] reported pt called out of work today citing stomach sx. Spoke with pt by phone. He reports that abd cramping started yesterday 9/25 at 3p. Then progressed to n/v/d. Last episode of vomiting early this am. Worst of cramping resolved last night but still with abd discomfort and diarrhea now. No fever.   Covid tests this morning and last night both negative. No one else around pt has reported being sick to him.   Advised pt he should stay out of work tomorrow 9/27 as well since still having diarrhea. Will plan re-evaluation tomorrow. If sx have resolved by this evening, will plan to have pt RTW Wed 9/28 pending clearance by clinic staff 9/27.

## 2021-01-05 ENCOUNTER — Encounter: Payer: Self-pay | Admitting: *Deleted

## 2021-01-05 ENCOUNTER — Other Ambulatory Visit: Payer: No Typology Code available for payment source

## 2021-01-05 NOTE — Telephone Encounter (Signed)
Pt returned call. Reports no additional emesis since yesterday AM. Diarrhea resolved yesterday late afternoon. Appetite returned today and he has been "eating all day" with no n/v/d or stomach cramps. Tolerating fluids well.  No new sx.  Cleared to RTW tomorrow 9/28 as long as no new or recurring sx overnight. Pt agreeable. Denies questions or concerns.

## 2021-01-05 NOTE — Telephone Encounter (Signed)
Reviewed RN Hildred Alamin notes agreed with plan of care will follow up with patient via telephone 01/06/21

## 2021-01-08 NOTE — Telephone Encounter (Signed)
Pt sent email to [employer] 9/28 with positive covid test after new sx started that day. HA, body aches.  Day 0 9/28. Day 5 10/3 RTW 10/4 with strict mask use thru 10/8.   Spoke with pt by phone. He reports HA and mild body aches continue but improved from previous. Appetite still good and tolerating fluids well still. Increased water intake this week and less coffee since not at work.  Discussed option of starting antivirals/molnupiravir and associated benefits vs possible side effects. Pt declines at this time since his sx are much improved.    Denies further questions or concerns. Plan for f/u Monday ahead of RTW Tuesday.

## 2021-01-10 NOTE — Telephone Encounter (Signed)
Patient contacted via telephone and stated was having body aches, fatigue, and headache yesterday.  Symptoms resolved today and covid home test negative today.  Patient rested at home today except to go grocery shopping.  Read book and watched movies.  Day 4 post positive test today estimated RTW Tuesday 10/4 as long as symptoms stay improved.  Discussed eating regular meals/hydrating and resting as needed this week. Discussed mask wear through Day 10 and no eating in employee lunch room this week.  Patient stated he would eat in car or designated room across from clinic due to inclement weather.   Patient reported blood sugars have been good.  Father has recovered from covid and mother never got sick/infected.  Patient A&Ox3 spoke full sentences without difficulty no cough/congestion/throat clearing noted during 6 minute telephone call.    HR notified symptoms improved estimated RTW 4 Oct with mask wear through Day 10 10/8 and no eating in employee lunch room.

## 2021-01-12 NOTE — Telephone Encounter (Signed)
Noted patient returned to work as expected today and feeling well wearing mask through Day 10 01/16/21

## 2021-01-12 NOTE — Telephone Encounter (Signed)
Pt did RTW today 10/4 as expected. Feels well. Mask use thru 10/8. Closing encounter.

## 2021-01-19 NOTE — Telephone Encounter (Signed)
Patient seen in workcenter feeling well denied symptoms A&Ox3 spoke full sentences without difficulty skin warm dry and pink respirations even and unlabored no congestion/throat clearing or cough noted during interaction. Day 10 01/16/21 Encounter closed.

## 2021-02-01 ENCOUNTER — Other Ambulatory Visit: Payer: Self-pay

## 2021-02-01 DIAGNOSIS — E1065 Type 1 diabetes mellitus with hyperglycemia: Secondary | ICD-10-CM

## 2021-02-02 MED ORDER — FREESTYLE LIBRE 14 DAY SENSOR MISC: 2 refills | Status: DC

## 2021-02-04 ENCOUNTER — Ambulatory Visit: Payer: No Typology Code available for payment source | Admitting: Primary Care

## 2021-02-05 ENCOUNTER — Other Ambulatory Visit: Payer: No Typology Code available for payment source

## 2021-02-13 NOTE — Telephone Encounter (Signed)
Late entry spoke with patient at 1043 02/12/21 requesting to return to work onsite.  Stated last GI upset was 02/11/21 approximate 1000  Denied fever/chills/cough/runny nose/new symptoms.  Blood sugar now under 200 and feeling well.  Patient A&Ox3 spoke full sentences without difficulty no audible cough/throat clearing/nasal congestion during 2 minute telephone conversation.  HR notified patient cleared to return onsite as fever/vomiting/diarrhea free x 24 hours.  Patient had covid bivalent vaccine earlier in the week on Tuesday 02/09/21 at work vaccine clinic with [facility].  Discussed with patient to contact his supervisor to see if he can start shift late.  Patient  verbalized understanding information/instructions, agreed with plan of care and had no further questions at this time.  Contacted patient via telephone stated supervisor approved starting shift late worked the rest of his shift without difficulty.  Denied questions or concerns today.  Blood sugar back to his baseline.  Usual po intake and voiding.  Feeling well.  Patient A&Ox3 spoke full sentences without difficulty no audible cough/congestion/throat clearing during 2 minute telephone call.  HR notified patient RTW as expected 02/12/21 Encounter closed.

## 2021-03-10 ENCOUNTER — Telehealth: Payer: Self-pay | Admitting: Registered Nurse

## 2021-03-10 ENCOUNTER — Encounter: Payer: Self-pay | Admitting: Registered Nurse

## 2021-03-10 DIAGNOSIS — Z719 Counseling, unspecified: Secondary | ICD-10-CM

## 2021-03-10 NOTE — Telephone Encounter (Signed)
Telephone message left for patient exitcare handouts on shingles transmission and shingles sent to patient my chart.  HR and patient supervisor notified patient cleared to return onsite. Patient to contact me if further questions or concerns.

## 2021-03-12 NOTE — Telephone Encounter (Signed)
Patient returned call stated he has had chicken pox as a child.  He asked about shingles vaccine.  Discussed routine vaccination age 40 but he should speak with PCM if recommended that he receive sooner or not.  Discussed typically most people feel unwell day after fever/malaise and recommended if he does need to schedule when he has next day off from work.  Discussed RN Hildred Alamin can order to give in clinic.  Patient notified shingles information in my chart and HR notified he is cleared to return onsite.  Discussed avoid touching any liquid from rash or rash directly.  If contact does occur wash hands thoroughly as soon as possible.  Patient verbalized understanding information/instructions, agreed with plan of care and had no further questions at this time.

## 2021-03-16 ENCOUNTER — Telehealth: Payer: Self-pay | Admitting: *Deleted

## 2021-03-16 DIAGNOSIS — R42 Dizziness and giddiness: Secondary | ICD-10-CM

## 2021-03-16 NOTE — Telephone Encounter (Signed)
Pt emailed RN this morning at [phone] stating "do we any covid tests in the clinic? I just feel off today. Starting last night I felt very dizzy, woke up around 0300 unable to sleep feeling dizzy, and still feel that way now. I'm okay to work but I feel very out of it and just wanted to check to make sure I am ok." Attempted to contact pt by phone x2 with no response and checked in dept without finding pt.  Reached pt at 1515 by phone. He reports all sx resolved around lunch time today and he feels well. Sts his CBG was normal through the dizziness and he didn't feel was r/t the sx. Since no sx currently for a few hours now, pt will remain onsite thru rest of shift but will do a home covid test this evening before reporting for next onsite shift. Pt agreeable and denies any needs or concerns.

## 2021-03-17 NOTE — Telephone Encounter (Signed)
Patient contacted via telephone stated covid test negative yesterday slept well and today denied symptoms blood sugars running in the 150s feeling well all symptoms resolved.  Patient received covid bivalent booster and flu vaccine last month.  Patient denied any questions or concerns.  Cleared to return onsite next scheduled shift and to contact clinic staff if new symptoms.  Patient A&Ox3 spoke full sentences without difficulty; no audible cough/congestion/throat clearing during 2 minute telephone call.  Patient verbalized understanding information/instructions, agreed with plan of care and had no further questions at this time.

## 2021-03-29 ENCOUNTER — Telehealth: Payer: Self-pay | Admitting: *Deleted

## 2021-03-29 ENCOUNTER — Encounter: Payer: Self-pay | Admitting: *Deleted

## 2021-03-29 DIAGNOSIS — U071 COVID-19: Secondary | ICD-10-CM

## 2021-03-29 DIAGNOSIS — H8110 Benign paroxysmal vertigo, unspecified ear: Secondary | ICD-10-CM

## 2021-03-29 NOTE — Telephone Encounter (Signed)
Pt called out of work today reporting positive covid test at home today. Reports mother in same household tested positive first. He tested prior to coming in to work. Multiple family members with covid after their recent travels. Was at work onsite last 12/16 and denies known close contacts.  Pt reports mild PND still present from a couple of weeks ago. Never fully resolved. Possibly fatigue present but not sure if this is just because he knows he is positive. So quarantine dates based on today's positive test.   Day 0 12/19 Day 5 12/24 RTW 12/25 or next scheduled workday 12/26 with strict mask use thru Day 10 12/29.  Pt verbalizes understanding and agreement. Denies further needs or concerns.

## 2021-03-29 NOTE — Telephone Encounter (Signed)
Reviewed RN Hildred Alamin note agreed with plan of care.  Exitcare handouts on covid home care, quarantine and emergency authorization use molnupiravir sent to patient my chart and left message to contact NP as risk level moderate for covid complications to discuss antiviral risks and benefits.

## 2021-03-30 MED ORDER — MOLNUPIRAVIR EUA 200MG CAPSULE: 4.0000 | ORAL_CAPSULE | Freq: Two times a day (BID) | ORAL | 0 refills | Status: AC

## 2021-03-30 NOTE — Telephone Encounter (Signed)
Case discussed with RN Hildred Alamin in clinic today I had not been able to talk with patient personally and left message.  Patient had not returned my calls yet.  Discussed to ask if patient would like covid antivirals as moderate risk severe covid.complications.  Reviewed RN Hildred Alamin note electronic Rx molnupiravir 200mg  sig t4 po BID x 5 days #40 RF0 signed and sent to patient preferred pharmacy.  Will follow up with patient via telephone 03/31/2021.  Emergency use authorization handout patient molnupiravir sent via my chart yesterday to patient.  Patient Day 1 today.  Renal function normal 11/04/20 GFR 110 Cr 0.84

## 2021-03-30 NOTE — Telephone Encounter (Signed)
Discussed use of covid antivirals and possible side effects. Pt would like to proceed with starting this. Confirmed preferred pharmacy does have molnupiravir in stock. Rx sent and advised pt of handouts available in MyChart with more detailed information. Pt appreciative and denies further questions or concerns.

## 2021-04-01 NOTE — Telephone Encounter (Signed)
Sx check in. No answer. LVM.

## 2021-04-01 NOTE — Telephone Encounter (Signed)
Pt confirmed 12/20 with RN by phone that he did RTW 03/18/21. Closing encounter.

## 2021-04-01 NOTE — Telephone Encounter (Signed)
Reviewed RN Hildred Alamin note confirmed 03/18/21 RTW date.  Encounter closed.
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
## 2021-04-04 NOTE — Telephone Encounter (Signed)
Patient contacted via telephone and stated feeling better denied fever previous 24 hours.  Body aches have decreased and now just feeling sore.  PO intake without difficulty.  Patient stated blood sugars 150-200s a little higher than usual he thinks due to decreased activity.  Patient having water, unsweet tea/gatorade.   Discussed with patient avoid dehydration, drink water to keep urine clear pale yellow and urinating every 4 hours when awake. Still taking his antivirals.  Patient reported still tested positive   Duration of call 7 minutes patient A&Ox3 spoke full sentences without difficulty; no congestion/throat clearing or cough audible.  Discussed strict mask wear through Day 10 12/29 Thursday, straw under mask for drinking fluids at work, no removing mask at work unless in private room designated for [employer] by clinic and no eating in employee lunch room until after day 10 and covid test negative.  Discussed with patient not uncommon for immunocompromised patients to test positive longer than 2 weeks and healthy patients typically testing positive for 6-7 days.  Patient instructed retest once symptoms improved in 3-4 days more since positive after 5 minutes today still.  Don't want him burning through all his tests daily testing.  Father still negative and asymptomatic at home.  HR notified patient cleared to return to work tomorrow with strict mask wear and no eating in employee lunch room through Day 10.  Patient verbalized understanding information/instructions, agreed with plan of care and had no further questions at this time.

## 2021-04-05 NOTE — Telephone Encounter (Signed)
Reviewed RN Hildred Alamin note agreed with plan of care and notified RN Hildred Alamin via telephone patient to go home if unable to wear mask as known to be still testing positive yesterday at home.

## 2021-04-05 NOTE — Telephone Encounter (Signed)
Pt reports he did report to work today as expected but left early. Started coughing often and had to take mask down to blow nose multiple times and did not feel he was able to remain effectively masked. He is going to rest and medicate today and try to RTW again tomorrow. Will follow normal call out procedures if unable to RTW tomorrow 12/27.

## 2021-04-06 NOTE — Telephone Encounter (Signed)
Reviewed RN Hildred Alamin note agreed with plan of care regarding mask use at work and symptoms.

## 2021-04-06 NOTE — Telephone Encounter (Signed)
Pt called out of work again today 12/27. Reported to supervisor per their email that he has a sore throat, still not feeling well but planning to RTW tomorrow 12/28. Called pt to discuss. No answer. LVM.

## 2021-04-06 NOTE — Telephone Encounter (Signed)
Pt returned call. Cough is still present but rhinorrhea is improved. Feels he will be able to RTW tomorrow and can effectively mask. He is still testing positive as of last night. Reminded him if sx worsen while he is at work and he cannot keep mask in place, to leave and notify clinic. Pt agreeable and denies needs or concerns.

## 2021-04-07 MED ORDER — MECLIZINE HCL 25 MG PO TABS: 25.0000 mg | ORAL_TABLET | Freq: Four times a day (QID) | ORAL | 0 refills | Status: AC | PRN

## 2021-04-07 NOTE — Telephone Encounter (Signed)
Patient contacted via telephone after received notification from HR patient called out sick again today. Patient reported he woke up super dizzy this morning.  He didn't know if related to stopping his antidepressant while taking covid antivirals and if that caused a problem because suddenly stopped medication but restarted today.  His mother had similar experience today with dizziness she also took covid antiviral/had recent positive covid test.  Patient reported runny nose improved today but typically a lot of post nasal drip in the morning.  I suggested supportive care, rest, good hygiene and encouraged the patient to take adequate fluids/hydrate with water.  Asked patient if blood sugars are in his normal range and he stated they were not high or low.  Tolerating po intake without difficulty.  Discussed with patient post nasal drip irritates throat/causes swelling blocks eustachian tubes from draining and fluid fills up middle ear.  Bacteria/viruses can grow in fluid and with moving head tube compressed and increases pressure in tube/ear worsening pain.  Studies show will take 30 days for fluid to resolve after post nasal drip controlled with nasal steroid/antihistamine. Antibiotics and steroids do not speed up fluid removal.  Discussed with patient fluid in middle ear (effusion) probably causing vertigo sensation/dysfunction of otoliths in fluid versus air but could also be age. Meclizine 25mg  po Supportive treatment may take up to 4 doses meclizine per day max 100mg  per 24 hours.  May purchase OTC or I can send electronic Rx for him.  Patient requested Rx to [facility] #30 RF0 sent to his pharmacy of choice.  Denied signs/symptoms stroke. Recommended not driving during vertigo episodes. Follow up if aphasia, dysphasia, visual changes, weakness, fall, worst headache of life, incoordination, fever, or ear discharge. Consider ENT evaluation/follow up with PCM if worsening symptoms not  controlled with meclizine.  Exitcare handouts on eustachian tube dysfunction, dizziness and vertigo sent to patient my chart. Patient dizziness improved almost resolved now and feels well enough to return to work tomorrow. Discussed if new or worsening symptoms overnight/am to stay home use normal call out procedures and contact clinic staff for re-evaluation.  Patient A&Ox3 spoke full sentences without difficulty; no cough/congestion/throat clearing audible during 6 minute telephone call.   HR notified patient cleared to RTW onsite 04/08/21 and if new or worsening symptoms will use normal call out procedures.  Patient verbalized understanding of information/agreed with plan of care and had no further questions at this time.

## 2021-04-08 NOTE — Telephone Encounter (Signed)
Called for sx check as pt called out of work again today. Reported possible virtual appt with pcp but nothing showing in [facility] currently. Since late in the day, advised pt on voicemail that if no new or worsening sx, he is still cleared to RTW 12/30 with mask use if sneezing or coughing as today Day 10. If not feeling well enough to work, follow normal call out procedures.

## 2021-04-09 NOTE — Telephone Encounter (Signed)
Pt did RTW yesterday 12/29. Feels well. All sx fully resolved. "Back to myself finally." Day 10 was 12/29. Closing encounter.

## 2021-04-09 NOTE — Telephone Encounter (Signed)
Reviewed RN Hildred Alamin note agreed with plan of care and will follow up with patient next week to verify if still taking meclizine.

## 2021-04-15 NOTE — Telephone Encounter (Signed)
RN Hildred Alamin discussed patient case with NP in clinic.  Off meclizine and feeling well encounter closed.  Day 10 04/08/21

## 2021-04-15 NOTE — Telephone Encounter (Signed)
Spoke with pt by phone. He reports dizziness resolved next day, around 12/30 and has not been taking meclizine since then. Feels 100% back to normal. Denies needs or concerns.

## 2021-05-02 ENCOUNTER — Other Ambulatory Visit: Payer: Self-pay | Admitting: Primary Care

## 2021-05-02 DIAGNOSIS — E1065 Type 1 diabetes mellitus with hyperglycemia: Secondary | ICD-10-CM

## 2021-05-02 NOTE — Telephone Encounter (Signed)
Received refill request for insulin. He is supposed to be following with endocrinology for his type 1 diabetes. I do not manage type 1.   Send refill request to his endocrinologist.

## 2021-05-03 NOTE — Telephone Encounter (Signed)
Left message to return call to our office.  

## 2021-05-07 ENCOUNTER — Encounter (INDEPENDENT_AMBULATORY_CARE_PROVIDER_SITE_OTHER): Payer: Self-pay

## 2021-05-21 ENCOUNTER — Encounter (INDEPENDENT_AMBULATORY_CARE_PROVIDER_SITE_OTHER): Payer: Self-pay

## 2021-05-25 ENCOUNTER — Telehealth: Payer: Self-pay | Admitting: *Deleted

## 2021-05-25 DIAGNOSIS — U071 COVID-19: Secondary | ICD-10-CM

## 2021-05-25 NOTE — Telephone Encounter (Signed)
Pt emailed taskforce reporting positive covid test today 2/14. Wasn't feeling well Friday 2/10. Called out of work. Was having dizziness and HA. Continued and he called out Monday 2/13. Negative covid tests Fri and Sat. Inconclusive this morning then second test this morning was positive, deep red color, immediate result. Discussed case with NP Otila Kluver. Pt does not want to do antivirals at this time since he is not feeling bad right now. HA only current sx plus uncontrolled sugars.  Day 1 2/14 Day 5 2/18 RTW 2/19 or next scheduled workday of 2/20 with strict mask use thru 2/23.

## 2021-05-26 ENCOUNTER — Other Ambulatory Visit: Payer: Self-pay | Admitting: Primary Care

## 2021-05-26 ENCOUNTER — Encounter: Payer: Self-pay | Admitting: *Deleted

## 2021-05-26 NOTE — Telephone Encounter (Signed)
RN Hildred Alamin discussed case with me in clinic yesterday due to covid test negative but symptomatic prior to positive test date.  Discussed due to viral shedding highest first 5 days after positive test Day 1 will be 05/25/21.  Agreed with plan of care per RN Hildred Alamin note/reviewed in [place].  Today patient contacted via telephone and stated feeling better today.  Blood sugars 150-190 today and if elevated responding to additional insulin today.  This infection is not feeling like previous covid infection no fever or body aches has only had dizziness, elevated sugars, and headache.  Patient asked if unusual he has covid again so soon.  Discussed with patient last [place] variant update 8-10 variants currently circulating.  Reinfection possible with each one and he is immunosuppressed due to diabetes uncontrolled last Hgba1c 12.09 Oct 2020.  Patient A&Ox3 spoke full sentences without difficulty no cough/shortness of breath/throat clearing audible during 7 minute telephone call.  Encouraged patient to hydrate and eat protein with each snack/meal.  Consider retesting home covid if symptoms resolved or after day 7 of symptoms as current research showing rapid tests staying positive to day 10-14 with current variants for viral shedding.  Discussed RTW Monday 2/20 Day 7 because not scheduled to work on the weekends.  Discussed first 5 days after positive test is the highest viral shedding and that is why quarantine starting on positive test date not symptoms date at this time.  HR notified symptoms improving and estimated RTW with mask wear 2/20 through Day 10 06/03/21.  Exitcare handouts sent to patient on covid home care and quarantine through my chart.  Patient can contact NP or RN Hildred Alamin if covid tests x 2 negative 48 hours apart prior to 2/20 or if new or worsening symptoms.  Encouraged patient to continue hydrating with water and avoiding dehydration.  Patient verbalized understanding information/instructions, agreed with plan of  care and had no further questions at this time.

## 2021-05-30 NOTE — Telephone Encounter (Signed)
Patient returned call feeling well and ready to return to work tomorrow.  Blood sugars have been good. Reiterated strict mask wear through 2/23 and no eating in employee lunch room and cleared to return onsite tomorrow.  Discussed mask required unless two negative tests 48 hours apart and symptoms resolved.  Patient A&Ox3 spoke full sentences without difficulty; no audible cough/congestion/throat clearing during 2 minute call.  Patient verbalized understanding information/instructions, agreed with plan of care and had no further questions at this time.  HR notified strict mask wear and no eating in employee lunch room through Day 10 and symptoms improved.

## 2021-06-01 NOTE — Telephone Encounter (Signed)
Patient seen walking dog in parking lot today.  Feeling well denied concerns.  Spoke full sentences without difficulty; no cough/congestion/throat clearing.  Skin warm dry and pink.  A&Ox3  gait sure and steady.

## 2021-06-12 ENCOUNTER — Other Ambulatory Visit: Payer: Self-pay | Admitting: Primary Care

## 2021-06-13 NOTE — Telephone Encounter (Signed)
Patient should be seeing endocrinology for his type 1 diabetes. ?I've referred him 4 times over the years, last time was Summer of 2022. ? ?Have him request a refill of his insulin through his endocrinologist please. ?Thanks. ?

## 2021-06-14 NOTE — Telephone Encounter (Signed)
Will call endo to get refill. He will let us know if any issues.  ?

## 2021-06-14 NOTE — Telephone Encounter (Signed)
Left message to return call to our office.  

## 2021-06-29 NOTE — Telephone Encounter (Signed)
Patient seen in warehouse today feeling well denied concerns or questions. Stated all symptoms resolved.   Gait sure and steady respirations even and unlabored skin warm dry and pink no audible cough/congestion/throat clearing A&Ox3.  Encounter closed ?

## 2021-08-05 ENCOUNTER — Encounter: Payer: Self-pay | Admitting: Registered Nurse

## 2021-08-05 ENCOUNTER — Telehealth: Payer: Self-pay | Admitting: Registered Nurse

## 2021-08-05 NOTE — Telephone Encounter (Signed)
Patient asking if there is therapist options/benefits.  Patient reminded that his employer has [employer] that can be reached at [email or web address] for 6 no cost visits; [employer] [facility] and [employer] network providers available also.  Patient thanked clinic staff for assistance and had no further questions or needs for appt at this time.  Patient aware [facility] provider unable to prescribe mental health medications due to contract limitations and he can contact clinic staff if he needs further assistance. ?

## 2021-08-10 NOTE — Telephone Encounter (Signed)
Patient seen in workcenter stated he was able to schedule appt with therapist and had no further questions or concerns at this time. ?

## 2021-09-13 ENCOUNTER — Telehealth: Payer: No Typology Code available for payment source | Admitting: Physician Assistant

## 2021-09-13 DIAGNOSIS — E1065 Type 1 diabetes mellitus with hyperglycemia: Secondary | ICD-10-CM

## 2021-09-13 DIAGNOSIS — J301 Allergic rhinitis due to pollen: Secondary | ICD-10-CM | POA: Diagnosis not present

## 2021-09-13 MED ORDER — LEVOCETIRIZINE DIHYDROCHLORIDE 5 MG PO TABS: 5.0000 mg | ORAL_TABLET | Freq: Every evening | ORAL | 0 refills | Status: DC

## 2021-09-13 MED ORDER — FLUTICASONE PROPIONATE 50 MCG/ACT NA SUSP: 2.0000 | Freq: Every day | NASAL | 0 refills | Status: DC

## 2021-09-13 NOTE — Progress Notes (Signed)
E visit for Allergic Rhinitis We are sorry that you are not feeling well.  Here is how we plan to help!  Based on what you have shared with me it looks like you have Allergic Rhinitis.  Rhinitis is when a reaction occurs that causes nasal congestion, runny nose, sneezing, and itching.  Most types of rhinitis are caused by an inflammation and are associated with symptoms in the eyes ears or throat. There are several types of rhinitis.  The most common are acute rhinitis, which is usually caused by a viral illness, allergic or seasonal rhinitis, and nonallergic or year-round rhinitis.  Nasal allergies occur certain times of the year.  Allergic rhinitis is caused when allergens in the air trigger the release of histamine in the body.  Histamine causes itching, swelling, and fluid to build up in the fragile linings of the nasal passages, sinuses and eyelids.  An itchy nose and clear discharge are common.  I recommend the following over the counter treatments: You should take a daily dose of antihistamine and Xyzal 5 mg take 1 tablet daily  I also would recommend a nasal spray: Flonase 2 sprays into each nostril once daily  HOME CARE:  You can use an over-the-counter saline nasal spray as needed Avoid areas where there is heavy dust, mites, or molds Stay indoors on windy days during the pollen season Keep windows closed in home, at least in bedroom; use air conditioner. Use high-efficiency house air filter Keep windows closed in car, turn AC on re-circulate Avoid playing out with dog during pollen season  GET HELP RIGHT AWAY IF:  If your symptoms do not improve within 10 days You become short of breath You develop yellow or green discharge from your nose for over 3 days You have coughing fits  MAKE SURE YOU:  Understand these instructions Will watch your condition Will get help right away if you are not doing well or get worse  Thank you for choosing an e-visit. Your e-visit answers  were reviewed by a board certified advanced clinical practitioner to complete your personal care plan. Depending upon the condition, your plan could have included both over the counter or prescription medications. Please review your pharmacy choice. Be sure that the pharmacy you have chosen is open so that you can pick up your prescription now.  If there is a problem you may message your provider in [place] to have the prescription routed to another pharmacy. Your safety is important to [place]. If you have drug allergies check your prescription carefully.  For the next 24 hours, you can use MyChart to ask questions about today's visit, request a non-urgent call back, or ask for a work or school excuse from your e-visit provider. You will get an email in the next two days asking about your experience. I hope that your e-visit has been valuable and will speed your recovery.   I provided 5 minutes of non face-to-face time during this encounter for chart review and documentation.

## 2021-09-15 ENCOUNTER — Ambulatory Visit: Payer: No Typology Code available for payment source | Admitting: Primary Care

## 2021-09-16 ENCOUNTER — Other Ambulatory Visit: Payer: Self-pay | Admitting: Registered Nurse

## 2021-09-17 ENCOUNTER — Encounter: Payer: Self-pay | Admitting: Registered Nurse

## 2021-09-17 ENCOUNTER — Telehealth: Payer: Self-pay | Admitting: Registered Nurse

## 2021-09-17 DIAGNOSIS — E559 Vitamin D deficiency, unspecified: Secondary | ICD-10-CM

## 2021-09-17 DIAGNOSIS — E1065 Type 1 diabetes mellitus with hyperglycemia: Secondary | ICD-10-CM

## 2021-09-17 LAB — CMP12+LP+TP+TSH+6AC+CBC/D/PLT
ALT: 17 IU/L (ref 0–44)
AST: 18 IU/L (ref 0–40)
Albumin/Globulin Ratio: 2.5 — ABNORMAL HIGH (ref 1.2–2.2)
Albumin: 4.9 g/dL (ref 4.0–5.0)
Alkaline Phosphatase: 101 IU/L (ref 44–121)
BUN/Creatinine Ratio: 14 (ref 9–20)
BUN: 12 mg/dL (ref 6–20)
Basophils Absolute: 0.1 10*3/uL (ref 0.0–0.2)
Basos: 1 %
Bilirubin Total: 0.3 mg/dL (ref 0.0–1.2)
Calcium: 9.5 mg/dL (ref 8.7–10.2)
Chloride: 97 mmol/L (ref 96–106)
Chol/HDL Ratio: 2.1 ratio (ref 0.0–5.0)
Cholesterol, Total: 174 mg/dL (ref 100–199)
Creatinine, Ser: 0.86 mg/dL (ref 0.76–1.27)
EOS (ABSOLUTE): 0.1 10*3/uL (ref 0.0–0.4)
Eos: 1 %
Estimated CHD Risk: <0.5 times avg. (ref 0.0–1.0)
Free Thyroxine Index: 2 (ref 1.2–4.9)
GGT: 27 IU/L (ref 0–65)
Globulin, Total: 2 g/dL (ref 1.5–4.5)
Glucose: 223 mg/dL — ABNORMAL HIGH (ref 70–99)
HDL: 82 mg/dL (ref >39)
Hematocrit: 42.2 % (ref 37.5–51.0)
Hemoglobin: 14.7 g/dL (ref 13.0–17.7)
Immature Grans (Abs): 0 10*3/uL (ref 0.0–0.1)
Immature Granulocytes: 0 %
Iron: 107 ug/dL (ref 38–169)
LDH: 168 IU/L (ref 121–224)
LDL Chol Calc (NIH): 80 mg/dL (ref 0–99)
Lymphocytes Absolute: 2.7 10*3/uL (ref 0.7–3.1)
Lymphs: 33 %
MCH: 31.8 pg (ref 26.6–33.0)
MCHC: 34.8 g/dL (ref 31.5–35.7)
MCV: 91 fL (ref 79–97)
Monocytes Absolute: 0.5 10*3/uL (ref 0.1–0.9)
Monocytes: 6 %
Neutrophils Absolute: 4.8 10*3/uL (ref 1.4–7.0)
Neutrophils: 59 %
Phosphorus: 3.3 mg/dL (ref 2.8–4.1)
Platelets: 368 10*3/uL (ref 150–450)
Potassium: 4.2 mmol/L (ref 3.5–5.2)
RBC: 4.62 x10E6/uL (ref 4.14–5.80)
RDW: 12.2 % (ref 11.6–15.4)
Sodium: 137 mmol/L (ref 134–144)
T3 Uptake Ratio: 23 % — ABNORMAL LOW (ref 24–39)
T4, Total: 8.6 ug/dL (ref 4.5–12.0)
TSH: 1.44 u[IU]/mL (ref 0.450–4.500)
Total Protein: 6.9 g/dL (ref 6.0–8.5)
Triglycerides: 59 mg/dL (ref 0–149)
Uric Acid: 2.7 mg/dL — ABNORMAL LOW (ref 3.8–8.4)
VLDL Cholesterol Cal: 12 mg/dL (ref 5–40)
WBC: 8.2 10*3/uL (ref 3.4–10.8)
eGFR: 113 mL/min/{1.73_m2} (ref >59)

## 2021-09-17 LAB — VITAMIN D 25 HYDROXY (VIT D DEFICIENCY, FRACTURES): Vit D, 25-Hydroxy: 25.7 ng/mL — ABNORMAL LOW (ref 30.0–100.0)

## 2021-09-17 LAB — HGB A1C W/O EAG: Hgb A1c MFr Bld: 12.2 % — ABNORMAL HIGH (ref 4.8–5.6)

## 2021-09-17 MED ORDER — CHOLECALCIFEROL 1.25 MG (50000 UT) PO TABS: 1.0000 | ORAL_TABLET | ORAL | 0 refills | Status: AC

## 2021-09-17 NOTE — Telephone Encounter (Signed)
[facility] labs drawn yesterday [facility] results have not crossed into epic yet; reviewed in [facility] portal see below.  Glucose  223 High mg/dL 70 - 99 Uric Acid  2.7                           Therapeutic target for gout patients: <6.0 Low mg/dL 3.8 - 8.4 BUN  12 mg/dL 6 - 20 Creatinine  0.86 mg/dL 0.76 - 1.27 eGFR  113  mL/min/1.73  BUN/Creatinine Ratio  14 9 - 20 Sodium  137 mmol/L 134 - 144 Potassium  4.2 mmol/L 3.5 - 5.2 Chloride  97 mmol/L 96 - 106 Calcium  9.5 mg/dL 8.7 - 10.2 Phosphorus  3.3 mg/dL 2.8 - 4.1 Protein, Total  6.9 g/dL 6.0 - 8.5 Albumin  4.9 g/dL 4.0 - 5.0 Globulin, Total  2.0 g/dL 1.5 - 4.5 A/G Ratio  2.5 High  1.2 - 2.2 Bilirubin, Total  0.3 mg/dL 0.0 - 1.2 Alkaline Phosphatase  101 IU/L 44 - 121 LDH  168 IU/L 121 - 224 AST (SGOT)  18 IU/L 0 - 40 ALT (SGPT)  17 IU/L 0 - 44 GGT  27 IU/L 0 - 65 Iron  107 ug/dL 38 - 169 .  Lipids  Cholesterol, Total  174 mg/dL 100 - 199 Triglycerides  59 mg/dL 0 - 149 HDL Cholesterol  82  mg/dL  VLDL Cholesterol Cal  12 mg/dL 5 - 40 LDL Chol Calc (NIH)  80 mg/dL 0 - 99 T. Chol/HDL Ratio  2.1 ratio 0.0 - 5.0 Please Note:                                                   T. Chol/HDL Ratio                                                            Men  Women                                              1/2 Avg.Risk  3.4    3.3                                                  Avg.Risk  5.0    4.4                                               2X Avg.Risk  9.6    7.1                                               3X Avg.Risk 23.4  11.0 Estimated CHD Risk  < 0.5                The CHD Risk is based on the T. Chol/HDL ratio. Other                factors affect CHD Risk such as hypertension, smoking,                diabetes, severe obesity, and family history of                premature CHD. times avg. 0.0 - 1.0 .  Thyroid  TSH  1.440 uIU/mL 0.450 - 4.500 Thyroxine  (T4)  8.6 ug/dL 4.5 - 12.0 T3 Uptake  23 Low % 24 - 39 Free Thyroxine Index  2.0 1.2 - 4.9 .  CBC, Platelet Ct, and Diff  WBC  8.2 x10E3/uL 3.4 - 10.8 RBC  4.62 x10E6/uL 4.14 - 5.80 Hemoglobin  14.7 g/dL 13.0 - 17.7 Hematocrit  42.2 % 37.5 - 51.0 MCV  91 fL 79 - 97 MCH  31.8 pg 26.6 - 33.0 MCHC  34.8 g/dL 31.5 - 35.7 RDW  12.2 % 11.6 - 15.4 Platelets  368 x10E3/uL 150 - 450 Neutrophils  59  %  Not Estab. Lymphs  33  %  Not Estab. Monocytes  6  %  Not Estab. Eos  1  %  Not Estab. Basos  1  %  Not Estab. Neutrophils (Absolute)  4.8 x10E3/uL 1.4 - 7.0 Lymphs (Absolute)  2.7 x10E3/uL 0.7 - 3.1 Monocytes(Absolute)  0.5 x10E3/uL 0.1 - 0.9 Eos (Absolute)  0.1 x10E3/uL 0.0 - 0.4 Baso (Absolute)  0.1 x10E3/uL 0.0 - 0.2 Immature Granulocytes  0  %  Not Estab. Immature Grans (Abs)  0.0 x10E3/uL 0.0 - 0.1 Hemoglobin A1c (#[id number]) Test Results Flag Units Reference Interval Hemoglobin A1c  12.2 High % 4.8 - 5.6 Please Note:                                                         .          Prediabetes: 5.7 - 6.4          Diabetes: >6.4          Glycemic control for adults with diabetes: <7.0 Vitamin D, 25-Hydroxy (#[id number]) Test Results Flag Units Reference Interval Vitamin D, 25-Hydroxy  25.7 Vitamin D deficiency has been defined by the [place] practice guideline as a level of serum 25-OH vitamin D less than 20 ng/mL (1,2). The Endocrine Society went on to further define vitamin D insufficiency as a level between 21 and 29 ng/mL (2). 1. IOM (Institute of Medicine). 2010. Dietary reference    intakes for calcium and D. [place]: The    [employer]. 2. Holick MF, Binkley [place], Bischoff-Ferrari HA, et al.    Evaluation, treatment, and prevention of vitamin D    deficiency: an Endocrine Society clinical practice    guideline. JCEM. 2011 Jul; 96(7):1911-30.    Hgba1c worsening  slightly (not at goal less than 7) and vitamin D low I recommend starting cholecalciferol 50,000 units by mouth weekly take with fatty meal as vitamin d needs fat to be absorbed. Verified preferred pharmacy and Rx sent electronically #12 RF0 Repeat blood level vitamin D  nonfasting in 3 months with Hgba1c.  Patient reported his endocrinologist left and he is establishing with new one.  He would like [employer] dietitian information sent to his my chart.  Discussed continued high blood sugar can affect nerves/organs/kidneys/liver negatively with chronic elevation.   Follow up with PCM/endocrinology elevated blood sugar worsening and slightly low T3 uptake and low uric acid.  Discussed with patient typically T3 uptake will improve with glucose control as will uric acid.  Please give exitcare handouts on prediabetes, prediabetes diet and [employer] dietitian information.   I recommend weight loss, exercise 150 minutes per week; dietary fiber 30 grams per day men; eat whole grains/fruits/vegetables; keep added sugars to less than 150 calories/9 teaspoons for men per American Heart Association; cholesterol, electrolytes, iron, kidney/liver function and complete blood count normal. Exitcare handouts on vitamin D deficiency, living with diabetes and [employer] dietitian information sent to his my chart per his preference.  Discussed TSH/T4 thyroid hormones normal.  Repeat weight, blood pressure, Vitamin D, and Hgab1c in 3 months nonfasting

## 2021-09-24 NOTE — Progress Notes (Signed)
Appt made for 12/24/21 for repeat labs and send to pt's work email.

## 2021-09-26 NOTE — Progress Notes (Signed)
Noted follow up lab appt scheduled

## 2021-09-29 NOTE — Telephone Encounter (Signed)
Labs scheduled for 24 Dec 2021 per RN Hildred Alamin.

## 2021-10-11 ENCOUNTER — Ambulatory Visit: Payer: No Typology Code available for payment source | Admitting: Nurse Practitioner

## 2021-10-19 DIAGNOSIS — E1065 Type 1 diabetes mellitus with hyperglycemia: Secondary | ICD-10-CM

## 2021-10-20 MED ORDER — FREESTYLE LIBRE 2 SENSOR MISC: 0 refills | Status: DC

## 2021-11-01 ENCOUNTER — Ambulatory Visit: Payer: Self-pay | Admitting: Family Medicine

## 2021-11-02 ENCOUNTER — Other Ambulatory Visit: Payer: Self-pay | Admitting: Primary Care

## 2021-11-02 DIAGNOSIS — F419 Anxiety disorder, unspecified: Secondary | ICD-10-CM

## 2021-11-02 DIAGNOSIS — E1065 Type 1 diabetes mellitus with hyperglycemia: Secondary | ICD-10-CM

## 2021-11-02 DIAGNOSIS — E785 Hyperlipidemia, unspecified: Secondary | ICD-10-CM

## 2021-11-02 NOTE — Telephone Encounter (Signed)
Patient hasn't been seen since July 2022 and needs an appointment for continued refills. He has a long history of no show's and cancellations. He must be seen before I can refill any meds.

## 2021-11-02 NOTE — Telephone Encounter (Signed)
Left message to return call to our office.  

## 2021-11-03 NOTE — Telephone Encounter (Signed)
Called patient got him set up for tomorrow at 320pm

## 2021-11-04 ENCOUNTER — Ambulatory Visit: Payer: No Typology Code available for payment source | Admitting: Primary Care

## 2021-11-04 ENCOUNTER — Encounter: Payer: Self-pay | Admitting: Primary Care

## 2021-11-04 VITALS — BP 120/62 | HR 102 | Temp 98.1°F | Ht 64.5 in | Wt 126.0 lb

## 2021-11-04 DIAGNOSIS — F32A Depression, unspecified: Secondary | ICD-10-CM

## 2021-11-04 DIAGNOSIS — F419 Anxiety disorder, unspecified: Secondary | ICD-10-CM

## 2021-11-04 DIAGNOSIS — E785 Hyperlipidemia, unspecified: Secondary | ICD-10-CM

## 2021-11-04 DIAGNOSIS — E1065 Type 1 diabetes mellitus with hyperglycemia: Secondary | ICD-10-CM

## 2021-11-04 DIAGNOSIS — Z23 Encounter for immunization: Secondary | ICD-10-CM

## 2021-11-04 MED ORDER — PEN NEEDLES 31G X 6 MM MISC: 0 refills | Status: AC

## 2021-11-04 MED ORDER — FREESTYLE LIBRE 3 SENSOR MISC: 1 refills | Status: AC

## 2021-11-04 MED ORDER — BUSPIRONE HCL 5 MG PO TABS: 5.0000 mg | ORAL_TABLET | Freq: Two times a day (BID) | ORAL | 0 refills | Status: DC

## 2021-11-04 MED ORDER — VENLAFAXINE HCL ER 150 MG PO CP24: 150.0000 mg | ORAL_CAPSULE | Freq: Every day | ORAL | 3 refills | Status: DC

## 2021-11-04 MED ORDER — LANTUS SOLOSTAR 100 UNIT/ML ~~LOC~~ SOPN: 10.0000 [IU] | PEN_INJECTOR | Freq: Every day | SUBCUTANEOUS | 0 refills | Status: DC

## 2021-11-04 MED ORDER — INSULIN LISPRO (1 UNIT DIAL) 100 UNIT/ML (KWIKPEN): 10.0000 [IU] | PEN_INJECTOR | Freq: Three times a day (TID) | SUBCUTANEOUS | 0 refills | Status: AC

## 2021-11-04 NOTE — Patient Instructions (Signed)
Start Lantus insulin, inject 10 units into the skin every night at bedtime.  Increase your Humalog insulin to 10 units three times daily with meals.  Start buspirone 5 mg for anxiety. Take 1 tablet by mouth once or twice daily.  Please follow up with endocrinology as scheduled.  It was a pleasure to see you today!

## 2021-11-04 NOTE — Telephone Encounter (Signed)
Patient evaluated today

## 2021-11-04 NOTE — Assessment & Plan Note (Signed)
Historically controlled until recently.  Continue venlafaxine ER 150 mg daily. Start buspirone 5 mg 1-2 times daily.  He will update.

## 2021-11-04 NOTE — Assessment & Plan Note (Signed)
Chronic hyperglycemia and uncontrolled diabetes for years. A1C of 12.2 in June 2023.  We had a frank discussion today regarding the absolute need to gain better control over his diabetes to prevent long term, permanent complications.  I agreed to prescribe some of his insulin until he can get in with endocrinology. Increase Humalog to 10 units TID. He will likely need higher doses but he was reluctant.  Start Lantus 10 units HS. Rx provided for [employer] 3.  Managed on ACE-I and statin. Pneumonia vaccine provided today.  Follow up with endocrinology as scheduled.

## 2021-11-04 NOTE — Assessment & Plan Note (Signed)
Controlled. Reviewed lipid panel from employer from June 2023.  Continue atorvastatin 40 mg daily

## 2021-11-04 NOTE — Progress Notes (Signed)
Subjective:    Patient ID: James Stanley, male    DOB: 10/04/1980, 41 y.o.   MRN: [id number]  HPI  James Stanley is a very pleasant 41 y.o. male with a history of hypertension, uncontrolled type 1 diabetes, anxiety depression, hyperlipidemia who presents today for follow up of chronic conditions.  1) Type 1 Diabetes: Uncontrolled for years, infrequent follow up with endocrinology despite multiple prior referrals. He is currently scheduled on September 5th, 2023 to establish with [facility] endocrinology. He is ready to get back on track with his diabetes management.   Current medications include: Humalog 7-10 units with each meal. He purchases this over the counter. He is afraid to increase his Humalog as he is afraid he will drop too low. He was once managed on Lantus and was able to gain much better control over glucose levels.   He is checking his blood glucose multiple times daily with his [facility] and is getting readings of low 200's mostly.   Last A1C: 12.2 in June 2023 per employer  Last Eye Exam: Due Last Foot Exam: Due  Pneumonia Vaccination: Never completed  Urine Microalbumin: None. Managed on ACE-I.  Statin: atorvastatin   Dietary changes since last visit: Overall healthy diet. Rarely junk food.      2) Anxiety and Depression: Currently managed on venlafaxine ER 150 mg daily. Increased symptoms over the last few months due to stress at work and with family.   Symptoms include feeling down, palpitations, abdominal pain, waking during the night. He denies thoughts of self harm. He does feel well managed on venlafaxine overall, but he feels as though he could benefit from an extra treatment temporarily.    Review of Systems  Eyes:  Negative for visual disturbance.  Respiratory:  Negative for shortness of breath.   Cardiovascular:  Negative for chest pain.  Endocrine: Positive for polydipsia, polyphagia and polyuria.  Neurological:  Negative for numbness.   Psychiatric/Behavioral:  The patient is nervous/anxious.          Past Medical History:  Diagnosis Date   Anxiety    Chickenpox    Depression    Diabetes mellitus without complication ([place])    Frequent headaches    Hypertension    Migraines     Social History   Socioeconomic History   Marital status: Single    Spouse name: Not on file   Number of children: Not on file   Years of education: Not on file   Highest education level: Not on file  Occupational History   Not on file  Tobacco Use   Smoking status: Never   Smokeless tobacco: Never  Substance and Sexual Activity   Alcohol use: No   Drug use: Not on file   Sexual activity: Not on file  Other Topics Concern   Not on file  Social History Narrative   Single.   Works at [employer].   Moved from [place].   Enjoys reading.   Social Determinants of Health   Financial Resource Strain: Not on file  Food Insecurity: Not on file  Transportation Needs: Not on file  Physical Activity: Not on file  Stress: Not on file  Social Connections: Not on file  Intimate Partner Violence: Not on file    History reviewed. No pertinent surgical history.  Family History  Problem Relation Age of Onset   Hyperlipidemia Mother    Mental illness Mother    Hypertension Father    Diabetes Father  Mental illness Father    Hyperlipidemia Maternal Grandmother    Heart disease Paternal Grandfather    Hypertension Paternal Grandfather    Testicular cancer Paternal Grandfather     No Known Allergies  Current Outpatient Medications on File Prior to Visit  Medication Sig Dispense Refill   atorvastatin (LIPITOR) 40 MG tablet Take 1 tablet (40 mg total) by mouth every evening. For cholesterol. 90 tablet 3   Cholecalciferol 1.25 MG (50000 UT) TABS Take 1 tablet by mouth once a week for 12 doses. 12 tablet 0   Continuous Blood Gluc Receiver (FREESTYLE LIBRE 14 DAY READER) DEVI Inject 1 Device into the skin daily. 1 Device 0    fluticasone (FLONASE) 50 MCG/ACT nasal spray Place 2 sprays into both nostrils daily. 16 g 0   levocetirizine (XYZAL) 5 MG tablet Take 1 tablet (5 mg total) by mouth every evening. 30 tablet 0   lisinopril (ZESTRIL) 2.5 MG tablet Take 1 tablet (2.5 mg total) by mouth daily. For kidney protection. 90 tablet 3   mupirocin ointment (BACTROBAN) 2 % APPLY TO AFFECTED AREA TWICE A DAY UNTIL HEALED 22 g 0   No current facility-administered medications on file prior to visit.    BP 120/62   Pulse (!) 102   Temp 98.1 F (36.7 C) (Oral)   Ht 5' 4.5" (1.638 m)   Wt 126 lb (57.2 kg)   SpO2 98%   BMI 21.29 kg/m  Objective:   Physical Exam Cardiovascular:     Rate and Rhythm: Normal rate and regular rhythm.  Pulmonary:     Effort: Pulmonary effort is normal.     Breath sounds: Normal breath sounds. No wheezing or rales.  Abdominal:     General: Bowel sounds are normal.     Palpations: Abdomen is soft.     Tenderness: There is no abdominal tenderness.  Musculoskeletal:     Cervical back: Neck supple.  Skin:    General: Skin is warm and dry.  Neurological:     Mental Status: He is alert and oriented to person, place, and time.           Assessment & Plan:   Problem List Items Addressed This Visit       Endocrine   Uncontrolled type 1 diabetes mellitus with hyperglycemia ([place]) - Primary    Chronic hyperglycemia and uncontrolled diabetes for years. A1C of 12.2 in June 2023.  We had a frank discussion today regarding the absolute need to gain better control over his diabetes to prevent long term, permanent complications.  I agreed to prescribe some of his insulin until he can get in with endocrinology. Increase Humalog to 10 units TID. He will likely need higher doses but he was reluctant.  Start Lantus 10 units HS. Rx provided for [employer] 3.  Managed on ACE-I and statin. Pneumonia vaccine provided today.  Follow up with endocrinology as scheduled.        Relevant Medications   Continuous Blood Gluc Sensor (FREESTYLE LIBRE 3 SENSOR) MISC   insulin glargine (LANTUS SOLOSTAR) 100 UNIT/ML Solostar Pen   insulin lispro (HUMALOG KWIKPEN) 100 UNIT/ML KwikPen   Insulin Pen Needle (PEN NEEDLES) 31G X 6 MM MISC     Other   Anxiety and depression    Historically controlled until recently.  Continue venlafaxine ER 150 mg daily. Start buspirone 5 mg 1-2 times daily.  He will update.      Relevant Medications   busPIRone (BUSPAR) 5 MG tablet  venlafaxine XR (EFFEXOR-XR) 150 MG 24 hr capsule   Hyperlipidemia    Controlled. Reviewed lipid panel from employer from June 2023.  Continue atorvastatin 40 mg daily          Pleas Koch, NP

## 2021-11-17 ENCOUNTER — Encounter (INDEPENDENT_AMBULATORY_CARE_PROVIDER_SITE_OTHER): Payer: Self-pay

## 2021-11-18 ENCOUNTER — Encounter: Payer: Self-pay | Admitting: Registered Nurse

## 2021-11-18 ENCOUNTER — Telehealth: Payer: Self-pay | Admitting: Registered Nurse

## 2021-11-18 DIAGNOSIS — Z0000 Encounter for general adult medical examination without abnormal findings: Secondary | ICD-10-CM

## 2021-11-18 NOTE — Telephone Encounter (Signed)
Patient signed Be Well release form, tobacco attestation no use in the previous 12 months.  Confirmed teledoc account.  BP and LDL/BMI at goal  Discussed Hgba1c not at goal and to keep follow up appts with PCM/endocrinology.  Patient met requirements for Be Well insurance discount.  HR Alexis notified.  Patient notified, verbalized understanding information and had no further questions at this time.

## 2021-11-28 ENCOUNTER — Other Ambulatory Visit: Payer: Self-pay | Admitting: Primary Care

## 2021-11-28 DIAGNOSIS — E1065 Type 1 diabetes mellitus with hyperglycemia: Secondary | ICD-10-CM

## 2021-12-04 ENCOUNTER — Other Ambulatory Visit: Payer: Self-pay | Admitting: Registered Nurse

## 2021-12-04 DIAGNOSIS — E1065 Type 1 diabetes mellitus with hyperglycemia: Secondary | ICD-10-CM

## 2021-12-04 DIAGNOSIS — Z8639 Personal history of other endocrine, nutritional and metabolic disease: Secondary | ICD-10-CM

## 2021-12-04 NOTE — Telephone Encounter (Signed)
Labs due second week of September.  No refill until serum level known.

## 2021-12-08 NOTE — Telephone Encounter (Signed)
Patient reported he has follow up appt with endocrinology in September/labs due.  Would like to have all labs at one time versus multiple venipunctures.  He will discuss vitamin D level with his endocrinologist.  Discussed need blood level to know what dose Vitamin D supplement he needs to continue.  Patient verbalized understanding information/instructions, agreed with plan of care and had no further questions at this time.

## 2021-12-24 ENCOUNTER — Ambulatory Visit: Payer: No Typology Code available for payment source

## 2021-12-24 DIAGNOSIS — E1065 Type 1 diabetes mellitus with hyperglycemia: Secondary | ICD-10-CM

## 2021-12-24 DIAGNOSIS — E559 Vitamin D deficiency, unspecified: Secondary | ICD-10-CM

## 2021-12-24 NOTE — Progress Notes (Signed)
Labs drawn 23g butterly L antecubital without difficulty. Patient tolerated well.  2x2 and coflex applied.  Patient instructed to leave in place for 15-20 minutes then replace with bandaid given to patient.  Patient verbalized understanding.  Patient will look for results in [place].

## 2021-12-25 ENCOUNTER — Other Ambulatory Visit: Payer: Self-pay | Admitting: Registered Nurse

## 2021-12-25 ENCOUNTER — Encounter: Payer: Self-pay | Admitting: Registered Nurse

## 2021-12-25 DIAGNOSIS — E1065 Type 1 diabetes mellitus with hyperglycemia: Secondary | ICD-10-CM

## 2021-12-25 DIAGNOSIS — Z8639 Personal history of other endocrine, nutritional and metabolic disease: Secondary | ICD-10-CM | POA: Insufficient documentation

## 2021-12-25 LAB — VITAMIN D 25 HYDROXY (VIT D DEFICIENCY, FRACTURES): Vit D, 25-Hydroxy: 45.9 ng/mL (ref 30.0–100.0)

## 2021-12-25 LAB — HGB A1C W/O EAG: Hgb A1c MFr Bld: 11.2 % — ABNORMAL HIGH (ref 4.8–5.6)

## 2021-12-25 MED ORDER — VITAMIN D3 50 MCG (2000 UT) PO CAPS: 2000.0000 [IU] | ORAL_CAPSULE | Freq: Every day | ORAL | Status: DC

## 2021-12-25 NOTE — Telephone Encounter (Signed)
Vit D, 25-Hydroxy 30.0 - 100.0 ng/mL 45.9  25.7 Low  CM   Comment: Vitamin D deficiency has been defined by the [place] practice guideline as a  level of serum 25-OH vitamin D less than 20 ng/mL (1,2).  The Endocrine Society went on to further define vitamin D  insufficiency as a level between 21 and 29 ng/mL (2).  1. IOM (Institute of Medicine). 2010. Dietary reference     intakes for calcium and D. [place]: The     [employer].  2. Holick MF, Binkley [place], Bischoff-Ferrari HA, et al.     Evaluation, treatment, and prevention of vitamin D     deficiency: an Endocrine Society clinical practice     guideline. JCEM. 2011 Jul; 96(7):1911-30.   Resulting Agency  [facility]         Narrative Performed by: Maryan Puls Performed at:  [address]  [address] [place], [place]  [id number]  Lab Director: Rush Farmer MD, Phone:  [phone]    Specimen Collected: 12/24/21 11:23 Last Resulted: 12/25/21 07:37      Lab Flowsheet    Order Details    View Encounter    Lab and Collection Details    Routing    Result History    View All Conversations on this Encounter      CM=Additional comments      Result Care Coordination   Result Notes   Olen Cordial, NP  12/25/2021  8:37 AM EDT Back to Top    My chart message sent to patient   [place],   Your Hgba1c has improved a point but is still very elevated.  Sustained blood sugars at this level can cause damage to the nerves of the body/eyes/organs/kidneys/blood vessels.  Ensure you are taking your medications every day.  Avoiding added sugars in diet greater than 30 grams per day.  Avoiding dehydration.  Reminder [facility] has dietitian that can assist you -6 free visits.  Information below.  Keep your follow up appts with endocrinology and Primary Care providers.   I recommend rechecking your vitamin D level and Hgba1c in 3 months.  Your level in the range  recommended and typically can be sustained with a lower dose supplement.  I recommend buying over the counter cholecalciferol 2,000 units and  Take 1 tablet or capsule daily with a meal.  If on recheck you have dropped again I will send in a prescription for the high dose weekly supplement.   If you would like printed copy of results or have further questions please let me know.   Sincerely,   Gerarda Fraction NP-C   Consider appt with [facility] dietitian -free x 6 visits; Virtual visits, nights and weekends available typically appts scheduled within 2 weeks of your request   Participants Receive  Group Accountability Sessions  1:1 Wellness Counseling Sessions  Bluetooth Body Composition Scale - tracks body fat, hydration, muscle mass & more!  Customized Nutrition Plan  Customized Fitness Plan  Customized Self-Care Strategies  Access to Nutrition & Fitness Tracking App  Goal Tracking and Reminders Program Cost [facility] Members receive up to 6 individual Wellness Counseling Sessions at no cost - Group sessions are offered at no cost if member attends individual sessions Interested in signing up? Follow this link: [email or web address] Want to learn more? Email [email or web address] to find out how this proven outcome program can work for you

## 2022-01-01 NOTE — Telephone Encounter (Signed)
Received transmittal error via epic.  Telephone message left for pharmacy staff Rx refused as patient needs appt/labs performed

## 2022-01-12 ENCOUNTER — Encounter: Payer: Self-pay | Admitting: Registered Nurse

## 2022-01-12 ENCOUNTER — Other Ambulatory Visit: Payer: Self-pay | Admitting: Registered Nurse

## 2022-01-12 DIAGNOSIS — Z8639 Personal history of other endocrine, nutritional and metabolic disease: Secondary | ICD-10-CM

## 2022-01-12 NOTE — Telephone Encounter (Signed)
Cholecalciferol 50,000 units refill refused as Vitamin D level 45.9 patient to take OTC D3 2000 units daily po now.  Pharmacy notified..  see lab results notes 9/16 and 12/30/21

## 2022-01-24 ENCOUNTER — Other Ambulatory Visit: Payer: Self-pay | Admitting: Registered Nurse

## 2022-01-24 DIAGNOSIS — L0390 Cellulitis, unspecified: Secondary | ICD-10-CM

## 2022-02-01 ENCOUNTER — Other Ambulatory Visit: Payer: Self-pay | Admitting: Primary Care

## 2022-02-01 DIAGNOSIS — F32A Depression, unspecified: Secondary | ICD-10-CM

## 2022-02-01 DIAGNOSIS — F419 Anxiety disorder, unspecified: Secondary | ICD-10-CM

## 2022-02-04 ENCOUNTER — Encounter: Payer: Self-pay | Admitting: Registered Nurse

## 2022-02-04 MED ORDER — MUPIROCIN 2 % EX OINT: TOPICAL_OINTMENT | CUTANEOUS | 0 refills | Status: DC

## 2022-02-04 NOTE — Telephone Encounter (Signed)
Patient reported yes he had requested refill used the last of previous tube on sore that has healed up but has reoccurring rashes/sores that occur due to hyperglycemia with diabetes uncontrolled. Last rx 09/15/2020 per epic review.  Electronic Rx refill sent to his pharmacy of choice mupirocin ointment 2% apply topical to affected area BID prn until healed #[id number] RF0  Patient notified Rx sent to his pharmacy.  Patient verbalized understanding information/instructions, agreed with plan of care and had no further questions at this time.

## 2022-03-08 ENCOUNTER — Other Ambulatory Visit: Payer: Self-pay | Admitting: Primary Care

## 2022-03-08 DIAGNOSIS — E1065 Type 1 diabetes mellitus with hyperglycemia: Secondary | ICD-10-CM

## 2022-03-13 ENCOUNTER — Other Ambulatory Visit: Payer: Self-pay | Admitting: Primary Care

## 2022-03-13 DIAGNOSIS — E1065 Type 1 diabetes mellitus with hyperglycemia: Secondary | ICD-10-CM

## 2022-03-13 NOTE — Telephone Encounter (Signed)
From: Juliet Rude To: Office of Pleas Koch, NP Sent: 03/11/2022 6:27 PM EST Subject: Medication Renewal Request  Refills have been requested for the following medications:   insulin glargine (LANTUS SOLOSTAR) 100 UNIT/ML Solostar Pen [[name]  Preferred pharmacy: [facility] Delivery method: Mail

## 2022-04-12 ENCOUNTER — Telehealth: Payer: No Typology Code available for payment source | Admitting: Physician Assistant

## 2022-04-12 ENCOUNTER — Ambulatory Visit (HOSPITAL_COMMUNITY): Payer: No Typology Code available for payment source

## 2022-04-12 DIAGNOSIS — H6691 Otitis media, unspecified, right ear: Secondary | ICD-10-CM | POA: Diagnosis not present

## 2022-04-12 MED ORDER — AMOXICILLIN 875 MG PO TABS: 875.0000 mg | ORAL_TABLET | Freq: Two times a day (BID) | ORAL | 0 refills | Status: AC

## 2022-04-12 NOTE — Progress Notes (Signed)
I have spent 5 minutes in review of e-visit questionnaire, review and updating patient chart, medical decision making and response to patient.   Ghislaine Harcum Cody Rebekkah Powless, PA-C    

## 2022-04-12 NOTE — Progress Notes (Signed)
E-Visit for Ear Pain - Acute Otitis Media   We are sorry that you are not feeling well. Here is how we plan to help!  Based on what you have shared with me it looks like you have Acute Otitis Media.  Acute Otitis Media is an infection of the middle or "inner" ear. This type of infection can cause redness, inflammation, and fluid buildup behind the tympanic membrane (ear drum).  The usual symptoms include: Earache/Pain Fever Upper respiratory symptoms Lack of energy/Fatigue/Malaise Slight hearing loss gradually worsening- if the inner ear fills with fluid What causes middle ear infections? Most middle ear infections occur when an infection such as a cold, leads to a build-up of mucus in the middle ear and causes the Eustachian tube (a thin tube that runs from the middle ear to the back of the nose) to become swollen or blocked.   This means mucus can't drain away properly, making it easier for an infection to spread into the middle ear.  How middle ear infections are treated: Most ear infections clear up within three to five days and don't need any specific treatment. If necessary, tylenol or ibuprofen should be used to relieve pain and a high temperature.  If you develop a fever higher than 102, or any significantly worsening symptoms, this could indicate a more serious infection moving to the middle/inner and needs face to face evaluation in an office by a provider.   Antibiotics aren't routinely used to treat middle ear infections, although they may occasionally be prescribed if symptoms persist or are particularly severe. Given your presentation,   I have prescribed Amoxicillin 875 mg one tablet twice daily for 10 days   Your symptoms should improve over the next 3 days and should resolve in about 7 days. Be sure to complete ALL of the prescription(s) given.  HOME CARE: Wash your hands frequently. If you are prescribed an ear drop, do not place the tip of the bottle on your ear or  touch it with your fingers. You can take Acetaminophen 650 mg every 4-6 hours as needed for pain.  If pain is severe or moderate, you can apply a heating pad (set on low) or hot water bottle (wrapped in a towel) to outer ear for 20 minutes.  This will also increase drainage.  GET HELP RIGHT AWAY IF: Fever is over 102.2 degrees. You develop progressive ear pain or hearing loss. Ear symptoms persist longer than 3 days after treatment.  MAKE SURE YOU: Understand these instructions. Will watch your condition. Will get help right away if you are not doing well or get worse.  Thank you for choosing an e-visit.  Your e-visit answers were reviewed by a board certified advanced clinical practitioner to complete your personal care plan. Depending upon the condition, your plan could have included both over the counter or prescription medications.  Please review your pharmacy choice. Make sure the pharmacy is open so you can pick up the prescription now. If there is a problem, you may contact your provider through MyChart messaging and have the prescription routed to another pharmacy.  Your safety is important to us. If you have drug allergies check your prescription carefully.   For the next 24 hours you can use MyChart to ask questions about today's visit, request a non-urgent call back, or ask for a work or school excuse. You will get an email with a survey after your eVisit asking about your experience. We would appreciate your feedback. I hope   that your e-visit has been valuable and will aid in your recovery.   

## 2022-04-13 ENCOUNTER — Ambulatory Visit: Payer: Self-pay | Admitting: Occupational Medicine

## 2022-04-13 DIAGNOSIS — H9201 Otalgia, right ear: Secondary | ICD-10-CM

## 2022-04-13 NOTE — Progress Notes (Signed)
Patient reports started abt yesterday for right ear infection. Patient reports having constant pain. 7/10 right now after ibuprofen. Patient has taking ibuprofen only takes edge off still very painful. Given Instant heat pack and reusable heat pack for use as needed. Encourage to take alternate taking tylenol and ibuprofen for pain. If not improvement after 24 hrs of abt let me know we will schedule to see Otila Kluver NP. Abt's can take 24-48 hrs to show improvement.

## 2022-04-14 ENCOUNTER — Encounter: Payer: Self-pay | Admitting: Registered Nurse

## 2022-04-14 ENCOUNTER — Telehealth: Payer: Self-pay | Admitting: Registered Nurse

## 2022-04-14 DIAGNOSIS — H9201 Otalgia, right ear: Secondary | ICD-10-CM

## 2022-04-14 NOTE — Telephone Encounter (Signed)
RN Kimrey contacted NP after patient in clinic complaining of ear pain s/p teledoc visit for otitis media.  Patient asking if he can start lidocaine drops in ear for pain control.  Discussed I do not recommend at this time.  Consider heat/cool pack, take antibiotics as prescribed, tylenol '1000mg'$  po q6h prn pain.  Follow up with me tomorrow if no improvement in pain as my next scheduled on site availability.

## 2022-04-28 ENCOUNTER — Other Ambulatory Visit: Payer: Self-pay | Admitting: Primary Care

## 2022-04-28 ENCOUNTER — Ambulatory Visit: Payer: Self-pay | Admitting: Occupational Medicine

## 2022-04-28 DIAGNOSIS — Z23 Encounter for immunization: Secondary | ICD-10-CM

## 2022-04-28 DIAGNOSIS — F32A Depression, unspecified: Secondary | ICD-10-CM

## 2022-04-29 ENCOUNTER — Other Ambulatory Visit (HOSPITAL_BASED_OUTPATIENT_CLINIC_OR_DEPARTMENT_OTHER): Payer: Self-pay

## 2022-04-29 MED ORDER — COVID-19 MRNA 2023-2024 VACCINE (COMIRNATY) 0.3 ML INJECTION
0.3000 mL | Freq: Once | INTRAMUSCULAR | 0 refills | Status: AC
  Filled 2022-04-29: qty 0.3, 1d supply, fill #0

## 2022-05-17 NOTE — Telephone Encounter (Signed)
Patient seen in parking lot stated all symptoms resolved feeling well denied concerns.  A&Ox3 spoke full sentences without difficulty skin warm dry and pink gait sure and steady respirations even and unlabored  No audible cough/wheezing/throat clearing.

## 2022-05-18 ENCOUNTER — Other Ambulatory Visit: Payer: Self-pay | Admitting: Registered Nurse

## 2022-05-18 DIAGNOSIS — Z0000 Encounter for general adult medical examination without abnormal findings: Secondary | ICD-10-CM

## 2022-05-18 DIAGNOSIS — Z8639 Personal history of other endocrine, nutritional and metabolic disease: Secondary | ICD-10-CM

## 2022-05-19 ENCOUNTER — Ambulatory Visit: Payer: Self-pay | Admitting: Occupational Medicine

## 2022-05-19 ENCOUNTER — Encounter: Payer: Self-pay | Admitting: Registered Nurse

## 2022-05-19 VITALS — BP 112/86 | HR 93

## 2022-05-19 DIAGNOSIS — Z23 Encounter for immunization: Secondary | ICD-10-CM

## 2022-05-19 DIAGNOSIS — Z0000 Encounter for general adult medical examination without abnormal findings: Secondary | ICD-10-CM

## 2022-05-19 MED ORDER — VITAMIN D3 50 MCG (2000 UT) PO CAPS: 2000.0000 [IU] | ORAL_CAPSULE | Freq: Every day | ORAL | 5 refills | Status: AC

## 2022-05-19 NOTE — Progress Notes (Signed)
Gave Flu shot. Reviewed recent labs. Scheduled May for May. Educated to check BP next week.

## 2022-05-19 NOTE — Telephone Encounter (Signed)
Epic reviewed Hgba1c and blood pressure did not meet Be Well goals for 2025.  Patient due full exec panel and vitamin D Jun/Sep 2024.  Will schedule Be Well fasting labs/weight/BP for June with patient.  RN Evlyn Kanner notified and my chart message sent to patient.  Electronic Rx cholecalciferol 2000 units po daily #30 RF5 to patient pharmacy of choice.

## 2022-05-20 NOTE — Telephone Encounter (Signed)
Patient notified Rx sent to his pharmacy of choice for vitamin D.  Discussed labs schedule with RN Evlyn Kanner for June 2024 for Be Well/vitamin D follow up.  Discussed [facility] dietitian available to help for free with diabetes/hypertension x 6 visits.  No copay required.  Patient A&Ox3 spoke full sentences without difficulty agreed with plan of care and had no further questions at this time.

## 2022-05-26 ENCOUNTER — Ambulatory Visit: Payer: Self-pay | Admitting: Occupational Medicine

## 2022-05-26 VITALS — BP 140/80

## 2022-05-26 DIAGNOSIS — I10 Essential (primary) hypertension: Secondary | ICD-10-CM

## 2022-05-26 NOTE — Progress Notes (Signed)
Will follow up next week. Decrease Salt intake and caffeine.

## 2022-08-04 ENCOUNTER — Other Ambulatory Visit: Payer: Self-pay | Admitting: Occupational Medicine

## 2022-08-18 ENCOUNTER — Other Ambulatory Visit: Payer: Self-pay | Admitting: Occupational Medicine

## 2022-08-18 DIAGNOSIS — Z8639 Personal history of other endocrine, nutritional and metabolic disease: Secondary | ICD-10-CM

## 2022-08-18 DIAGNOSIS — Z0000 Encounter for general adult medical examination without abnormal findings: Secondary | ICD-10-CM

## 2022-08-18 NOTE — Progress Notes (Signed)
Lab drawn from Left AC tolerated well no issues noted.   

## 2022-08-19 ENCOUNTER — Other Ambulatory Visit: Payer: Self-pay | Admitting: Registered Nurse

## 2022-08-19 ENCOUNTER — Encounter: Payer: Self-pay | Admitting: Registered Nurse

## 2022-08-19 DIAGNOSIS — Z8639 Personal history of other endocrine, nutritional and metabolic disease: Secondary | ICD-10-CM

## 2022-08-19 DIAGNOSIS — E1065 Type 1 diabetes mellitus with hyperglycemia: Secondary | ICD-10-CM

## 2022-08-19 NOTE — Progress Notes (Signed)
My chart message sent to patient  James Stanley,  Your Hgba1c has worsened and is still very elevated.  Sustained blood sugars at this level can cause damage to the nerves of the body/eyes/organs/kidneys/blood vessels.  Ensure you are taking your medications every day.  Please speak with your endocrinologist regarding medication dose adjustments.  Avoiding added sugars in diet greater than 30 grams per day.  Avoiding dehydration.  Reminder [facility] has dietitian that can assist you -6 free visits.  Link to schedule appts [facility] ([email or web address]  Your spot fasting glucose was 347.  Total cholesterol and LDL were elevated and worse from last year.  Your creatinine and T3 thyroid hormone were low.  Some of your labs are still pending results uric acid and other thyroid hormones, .  Keep your follow up appts with endocrinology and Primary Care providers.  Have you had your dental and eye appointments this year?  I recommend rechecking your Hgba1c in 3 months.  Your vitamin D level in the range recommended and typically can be sustained with a lower dose supplement.  I recommend buying over the counter cholecalciferol 2,000 units and Take 1 tablet or capsule daily with a meal.  If on recheck in 1 year you have dropped again I will send in a prescription for the high dose weekly supplement.  [facility] handouts on diabetes and nutrition and high fiber diet sent to your my chart.  If you would like printed copy of results or have further questions please let me know.  Sincerely,  James Billet NP-C  Consider appt with [facility] dietitian -free x 6 visits; Virtual visits, nights and weekends available typically appts scheduled within 2 weeks of your request  Participants Receive  Group Accountability Sessions  1:1 Wellness Counseling Sessions  Bluetooth Body Composition Scale - tracks body fat, hydration, muscle mass & more!  Customized Nutrition Plan  Customized Fitness Plan  Customized Self-Care Strategies   Access to Nutrition & Fitness Tracking App  Goal Tracking and Reminders Program Cost [facility] Members receive up to 6 individual Wellness Counseling Sessions at no cost - Group sessions are offered at no cost if member attends individual sessions Interested in signing up? Follow this link: [email or web address] Want to learn more? Email [email or web address] to find out how this proven outcome program can work for you

## 2022-08-19 NOTE — Progress Notes (Signed)
My chart message sent to patient James Stanley, James Stanley did not meet requirements for Be Well 2025 discount at this time.  Please see RN Kimrey for paperwork to take to PCM/endocrinology for completion on alternative plan for you to improve LDL cholesterol and blood sugars.  To get discount blood pressure must be less than 135/85, LDL cholesterol less than 409 and Hgba1c less than 7 (2 of 3 measures must be met to get discount by 31 Jul or alternatives met). Sincerely, Albina Billet NP-C

## 2022-08-20 LAB — CMP12+LP+TP+TSH+6AC+CBC/D/PLT
ALT: 10 IU/L (ref 0–44)
AST: 14 IU/L (ref 0–40)
Albumin/Globulin Ratio: 2.1 (ref 1.2–2.2)
Albumin: 4.4 g/dL (ref 4.1–5.1)
Alkaline Phosphatase: 112 IU/L (ref 44–121)
BUN/Creatinine Ratio: 19 (ref 9–20)
BUN: 14 mg/dL (ref 6–24)
Basophils Absolute: 0.1 10*3/uL (ref 0.0–0.2)
Basos: 1 %
Bilirubin Total: 0.2 mg/dL (ref 0.0–1.2)
Calcium: 8.8 mg/dL (ref 8.7–10.2)
Chloride: 98 mmol/L (ref 96–106)
Chol/HDL Ratio: 3.3 ratio (ref 0.0–5.0)
Cholesterol, Total: 219 mg/dL — ABNORMAL HIGH (ref 100–199)
Creatinine, Ser: 0.73 mg/dL — ABNORMAL LOW (ref 0.76–1.27)
EOS (ABSOLUTE): 0.1 10*3/uL (ref 0.0–0.4)
Eos: 2 %
Estimated CHD Risk: <0.5 times avg. (ref 0.0–1.0)
Free Thyroxine Index: 1.8 (ref 1.2–4.9)
GGT: 20 IU/L (ref 0–65)
Globulin, Total: 2.1 g/dL (ref 1.5–4.5)
Glucose: 347 mg/dL — ABNORMAL HIGH (ref 70–99)
HDL: 67 mg/dL (ref >39)
Hematocrit: 42.5 % (ref 37.5–51.0)
Hemoglobin: 14.3 g/dL (ref 13.0–17.7)
Immature Grans (Abs): 0 10*3/uL (ref 0.0–0.1)
Immature Granulocytes: 0 %
Iron: 73 ug/dL (ref 38–169)
LDH: 163 IU/L (ref 121–224)
LDL Chol Calc (NIH): 141 mg/dL — ABNORMAL HIGH (ref 0–99)
Lymphocytes Absolute: 2.4 10*3/uL (ref 0.7–3.1)
Lymphs: 35 %
MCH: 31.5 pg (ref 26.6–33.0)
MCHC: 33.6 g/dL (ref 31.5–35.7)
MCV: 94 fL (ref 79–97)
Monocytes Absolute: 0.5 10*3/uL (ref 0.1–0.9)
Monocytes: 7 %
Neutrophils Absolute: 3.8 10*3/uL (ref 1.4–7.0)
Neutrophils: 55 %
Phosphorus: 3.5 mg/dL (ref 2.8–4.1)
Platelets: 343 10*3/uL (ref 150–450)
Potassium: 4.3 mmol/L (ref 3.5–5.2)
RBC: 4.54 x10E6/uL (ref 4.14–5.80)
RDW: 11.7 % (ref 11.6–15.4)
Sodium: 134 mmol/L (ref 134–144)
T3 Uptake Ratio: 20 % — ABNORMAL LOW (ref 24–39)
T4, Total: 8.8 ug/dL (ref 4.5–12.0)
TSH: 2.39 u[IU]/mL (ref 0.450–4.500)
Total Protein: 6.5 g/dL (ref 6.0–8.5)
Triglycerides: 63 mg/dL (ref 0–149)
Uric Acid: 2.9 mg/dL — ABNORMAL LOW (ref 3.8–8.4)
VLDL Cholesterol Cal: 11 mg/dL (ref 5–40)
WBC: 6.8 10*3/uL (ref 3.4–10.8)
eGFR: 118 mL/min/{1.73_m2} (ref >59)

## 2022-08-20 LAB — VITAMIN D 25 HYDROXY (VIT D DEFICIENCY, FRACTURES): Vit D, 25-Hydroxy: 33.2 ng/mL (ref 30.0–100.0)

## 2022-08-20 LAB — HEMOGLOBIN A1C
Est. average glucose Bld gHb Est-mCnc: 289 mg/dL
Hgb A1c MFr Bld: 11.7 % — ABNORMAL HIGH (ref 4.8–5.6)

## 2022-08-23 ENCOUNTER — Ambulatory Visit: Payer: Self-pay | Admitting: Occupational Medicine

## 2022-08-23 VITALS — BP 120/84 | Ht 68.0 in | Wt 127.1 lb

## 2022-08-23 DIAGNOSIS — E559 Vitamin D deficiency, unspecified: Secondary | ICD-10-CM

## 2022-08-23 DIAGNOSIS — Z0000 Encounter for general adult medical examination without abnormal findings: Secondary | ICD-10-CM

## 2022-08-23 DIAGNOSIS — I10 Essential (primary) hypertension: Secondary | ICD-10-CM

## 2022-08-23 DIAGNOSIS — Z8639 Personal history of other endocrine, nutritional and metabolic disease: Secondary | ICD-10-CM

## 2022-08-23 DIAGNOSIS — E1065 Type 1 diabetes mellitus with hyperglycemia: Secondary | ICD-10-CM

## 2022-08-23 NOTE — Progress Notes (Signed)
Be well insurance premium discount evaluation: Not met given paperwork to do alternative for PCP / endocrinology  Epic reviewed by RN Bess Kinds and transcribed labs.  Tobacco attestation signed. [facility] ROI formed signed. Forms placed in the chart.   Patient given handouts for [facility] pharmacies and discount drugs list,MyChart, Tele doc setup, Tele doc [address], [place] counseling and [employer] counseling.  What to do for infectious illness protocol. Given handout for list of medications that can be filled at [facility]. Given Clinic hours and Clinic Email.

## 2022-09-25 NOTE — Progress Notes (Signed)
Patient had follow up labs drawn

## 2022-11-03 ENCOUNTER — Other Ambulatory Visit: Payer: Self-pay | Admitting: Primary Care

## 2022-11-03 DIAGNOSIS — E1065 Type 1 diabetes mellitus with hyperglycemia: Secondary | ICD-10-CM

## 2022-11-03 DIAGNOSIS — F419 Anxiety disorder, unspecified: Secondary | ICD-10-CM

## 2022-11-03 NOTE — Telephone Encounter (Signed)
Unable to reach patient. Left voicemail to return call to our office.   

## 2022-11-03 NOTE — Telephone Encounter (Signed)
Patient is due for CPE/follow up, this will be required prior to any further refills.  Please schedule, thank you!   

## 2022-11-03 NOTE — Telephone Encounter (Signed)
LVM for patient to c/b and schedule.  

## 2022-11-03 NOTE — Telephone Encounter (Signed)
Please call patient:  Received refill requests this AM. I filled buspirone and venlafaxine. He should have another 30 days supply on hand of his lisinopril. Let me know if not.  Please also set him up for CPE/follow-up as he is due.

## 2022-11-04 NOTE — Progress Notes (Signed)
Patient met with RN Bess Kinds  08/23/22 discussed results/results note instructions and given copy printed of each.  Discussed with him did not meet Be Well 2025 requirements for insurance discount and noted what alternatives are/instructed to schedule follow up with PCM/endocrinology.  Epic review today has not seen endocrinology overdue appt Apr 2024 and [facility] has commend needs appt for medication refills requested today.  Alternative paperwork pending completion after PCM/endocrinology appts deadline 09 Nov 2022 to HR patient aware.  Patient sent my chart message reminder today also

## 2022-11-04 NOTE — Telephone Encounter (Signed)
Unable to reach patient. Left voicemail to return call to our office.   

## 2022-11-08 NOTE — Telephone Encounter (Signed)
Unable to reach patient. Left voicemail to return call to our office.   3rd attempt, mailing letter to patient

## 2022-11-09 NOTE — Progress Notes (Signed)
Patient discussed with RN Bess Kinds has had work and personal stressors has not taken care of his diabetes/self but plans to schedule follow up appts with PCM/endocrinology.  Patient aware he did not meet requirements for Be Well 2025 FY insurance discount or complete alternatives; HR team notified by RN Bess Kinds

## 2022-11-28 ENCOUNTER — Other Ambulatory Visit: Payer: Self-pay | Admitting: Primary Care

## 2022-11-28 DIAGNOSIS — F32A Depression, unspecified: Secondary | ICD-10-CM

## 2022-12-02 ENCOUNTER — Other Ambulatory Visit: Payer: Self-pay | Admitting: Primary Care

## 2022-12-02 DIAGNOSIS — F32A Depression, unspecified: Secondary | ICD-10-CM

## 2022-12-05 ENCOUNTER — Ambulatory Visit (HOSPITAL_COMMUNITY): Payer: No Typology Code available for payment source

## 2022-12-06 ENCOUNTER — Other Ambulatory Visit: Payer: Self-pay | Admitting: Primary Care

## 2022-12-06 ENCOUNTER — Other Ambulatory Visit: Payer: Self-pay | Admitting: Registered Nurse

## 2022-12-06 ENCOUNTER — Encounter: Payer: Self-pay | Admitting: Registered Nurse

## 2022-12-06 DIAGNOSIS — F419 Anxiety disorder, unspecified: Secondary | ICD-10-CM

## 2022-12-06 DIAGNOSIS — E1065 Type 1 diabetes mellitus with hyperglycemia: Secondary | ICD-10-CM

## 2022-12-06 DIAGNOSIS — F32A Depression, unspecified: Secondary | ICD-10-CM

## 2022-12-06 MED ORDER — VENLAFAXINE HCL ER 150 MG PO CP24
150.0000 mg | ORAL_CAPSULE | Freq: Every day | ORAL | 0 refills | Status: AC
Start: 2022-12-06 — End: ?

## 2022-12-06 MED ORDER — BUSPIRONE HCL 5 MG PO TABS
5.0000 mg | ORAL_TABLET | Freq: Two times a day (BID) | ORAL | 0 refills | Status: DC
Start: 2022-12-06 — End: 2023-01-20

## 2022-12-06 NOTE — Progress Notes (Unsigned)
    Kemi Gell T. Calistro Rauf, MD, CAQ Sports Medicine [facility] [address] [place], [address]  Phone: [phone]  FAX: [phone]  MATTIAS PATNODE - 42 y.o. male  MRN [id number]  Date of Birth: May 07, 1980  Date: 12/07/2022  PCP: Doreene Nest, NP  Referral: Doreene Nest, NP  No chief complaint on file.  Subjective:   CLEAVELAND SEGA is a 42 y.o. very pleasant male patient with There is no height or weight on file to calculate BMI. who presents with the following:  This is a new patient to me with type 1 diabetes who presents with some ongoing nausea, vomiting, and diarrhea.    Review of Systems is noted in the HPI, as appropriate  Objective:   There were no vitals taken for this visit.  GEN: No acute distress; alert,appropriate. PULM: Breathing comfortably in no respiratory distress PSYCH: Normally interactive.   Laboratory and Imaging Data:  Assessment and Plan:   ***

## 2022-12-07 ENCOUNTER — Encounter: Payer: Self-pay | Admitting: Family Medicine

## 2022-12-07 ENCOUNTER — Ambulatory Visit: Payer: No Typology Code available for payment source | Admitting: Family Medicine

## 2022-12-07 VITALS — BP 90/60 | HR 112 | Temp 98.7°F | Ht 68.0 in | Wt 120.0 lb

## 2022-12-07 DIAGNOSIS — A084 Viral intestinal infection, unspecified: Secondary | ICD-10-CM

## 2022-12-07 DIAGNOSIS — E1065 Type 1 diabetes mellitus with hyperglycemia: Secondary | ICD-10-CM | POA: Diagnosis not present

## 2022-12-07 MED ORDER — SEMGLEE (YFGN) 100 UNIT/ML ~~LOC~~ SOPN: 15.0000 [IU] | PEN_INJECTOR | Freq: Every day | SUBCUTANEOUS | 0 refills | Status: AC

## 2022-12-13 ENCOUNTER — Encounter: Payer: Self-pay | Admitting: Registered Nurse

## 2022-12-13 NOTE — Telephone Encounter (Signed)
Spoke with patient stated did not request refill and stated must have been autofill request by his pharmacy.  Rx refused.  Patient denied rash/infection.

## 2022-12-15 ENCOUNTER — Ambulatory Visit: Payer: No Typology Code available for payment source | Admitting: Primary Care

## 2022-12-16 ENCOUNTER — Encounter: Payer: Self-pay | Admitting: Registered Nurse

## 2022-12-16 ENCOUNTER — Telehealth: Payer: Self-pay | Admitting: Registered Nurse

## 2022-12-16 DIAGNOSIS — Z7189 Other specified counseling: Secondary | ICD-10-CM

## 2022-12-16 MED ORDER — COVID-19 ANTIGEN TEST VI KIT: 1.0000 | PACK | Freq: Every day | 1 refills | Status: AC | PRN

## 2022-12-16 NOTE — Telephone Encounter (Signed)
Patient ran out of covid tests insurance requires rx to fill 4 covered tests per month now.  Electronic rx sent to his pharmacy of choice patient stated he will pick up later today.  Discussed test if symptoms and retest in 48 hours x 2 if initial 2 tests negative per CDC guidelines if symptomatic.  Patient was notified covid wastewater levels high in local area and new cases still being seen in community.  Send NP picture of positive test and go home/stay home if positive test quarantine x 5 days.  Patient verbalized understanding information/instructions and had no further questions at this time.

## 2022-12-30 ENCOUNTER — Other Ambulatory Visit: Payer: Self-pay | Admitting: Primary Care

## 2022-12-30 DIAGNOSIS — F419 Anxiety disorder, unspecified: Secondary | ICD-10-CM

## 2023-01-03 ENCOUNTER — Other Ambulatory Visit: Payer: Self-pay | Admitting: Primary Care

## 2023-01-03 DIAGNOSIS — F32A Depression, unspecified: Secondary | ICD-10-CM

## 2023-01-06 ENCOUNTER — Other Ambulatory Visit: Payer: Self-pay | Admitting: Primary Care

## 2023-01-06 DIAGNOSIS — F32A Depression, unspecified: Secondary | ICD-10-CM

## 2023-01-06 MED ORDER — VENLAFAXINE HCL ER 150 MG PO CP24
150.0000 mg | ORAL_CAPSULE | Freq: Every day | ORAL | 0 refills | Status: DC
Start: 2023-01-06 — End: 2023-01-20

## 2023-01-11 ENCOUNTER — Ambulatory Visit: Payer: No Typology Code available for payment source | Admitting: Primary Care

## 2023-01-12 ENCOUNTER — Encounter: Payer: Self-pay | Admitting: Primary Care

## 2023-01-13 ENCOUNTER — Telehealth: Payer: Self-pay | Admitting: Registered Nurse

## 2023-01-13 ENCOUNTER — Encounter: Payer: Self-pay | Admitting: Registered Nurse

## 2023-01-13 DIAGNOSIS — U071 COVID-19: Secondary | ICD-10-CM

## 2023-01-13 MED ORDER — MOLNUPIRAVIR EUA 200MG CAPSULE: 4.0000 | ORAL_CAPSULE | Freq: Two times a day (BID) | ORAL | 0 refills | Status: AC

## 2023-01-13 NOTE — Telephone Encounter (Signed)
Patient contacted supervisor stated home covid tests positive this am x 2 01/13/2023  Symptoms started yesterday different from allergies.  Having headache, congestion, rhinitis and blood sugar elevated from normal.  Denied n/v/d/f/c/dyspnea/shortness of breath/cough.  Denied known sick contacts.  Pt began quarantine today 01/13/23. Lives with parents and keeping himself to one side of house and keeping his dog with him in bedroom. Patient did not develop symptoms of  trouble breathing, chest pain, nausea, vomiting, diarrhea, fever or chills.   5 day quarantine per [facility] recommendations. Day 1 of quarantine was 01/13/2023. Patient to contact clinic staff if vomiting after coughing or unable to tolerate po fluids.  Discussed flu and other viral illnesses circulating in community and some causing GI upset.  If GI upset I have recommended clear fluids then bland diet.  Avoid dairy/spicy, fried and large portions of meat while having nausea.  If vomiting hold po intake x 1 hour.  Then sips clear fluids like broths, ginger ale, power ade, gatorade, pedialyte may advance to soft/bland if no vomiting x 24 hours and appetite returned otherwise hydration main focus. Call me at work from home number if symptoms not improved with plan of care  patient to call if high fever, dehydration, marked weakness, fainting, increased abdominal pain, blood in stool or vomit (red or black).     Reviewed possible Covid symptoms including cough, shortness of breath with exertion or at rest, runny nose, congestion, sinus pain/pressure, sore throat, fever/chills, body aches, fatigue, loss of taste/smell, GI symptoms of nausea/vomiting/diarrhea. Also reviewed same day/emergent eval/ER precautions of dizziness/syncope, confusion, blue tint to lips/face, severe shortness of breath/difficulty breathing/wheezing. Discussed contact PCM if questions on his insulin sliding scale while sick.  Patient to isolate in own room and if possible use only one  bathroom if living with others in home.  Wear mask when out of room to help prevent spread to others in household.  Sanitize high touch surfaces with lysol/chlorox/bleach spray or wipes daily as viruses are known to live on surfaces from 24 hours to days.  Parents to covid test today if symptoms and again in 6 days if asymptomatic.  Discussed increasing fresh air flow in home can help to decrease transmission to parents also.  Today 70 degrees outside patient stated he will open bedroom windows to increase fresh air in home.  Patient does want antivirals.  Patient at higher risk for hospitalization due to diabetes, hyperlipidemia and hypertension.  Patient is not up to date on covid vaccines.  Recommend annual booster 60 days after infection resolution.  Patient is on prescription medications or daily medications. If taking medications epocrates interaction checker used to verify if any drug interactions. Patient last took molnupiravir in 2022 without difficulty.  Patient molnupiravir emergency use handout sent to patient electronically along with covid quarantine exitcare handout in my chart  Discussed how to take  molnupiravir 400mg  take 4 tabs by mouth every 12 hours x 5 days. Discussed lemonade can sometimes help with metallic/plastic taste in mouth (side effect medication).  Discussed most common side effects GI upset and bad taste in mouth.    Discussed I recommended not having sex with anyone while sick/testing positive/10 day quarantine as could spread virus to partner.  Discussed with patient he can reach me via my chart or [email or web address] if questions/concerns.  Notify me if [facility] does not have moln[facility]avir in stock as works best to start antiviral as soon as possible.  Discussed avoid dehydration as increased losses with rhinitis/congestion/mouth  breathing up to a liter per day.   May use salt water gargles and nasal saline 2 sprays each nostril q2h prn congestion/sore throat.  Research has  shown it helps to prevent hospitalizations and decrease discomfort.   May use flonase nasal 1 spray each nostril BID prn rhinitis.  Discussed may use nasal saline as often as he wants.  Shower BID to help with congestion am/pm.  Use nasal saline first may swallow or spit out liquid.  Use flonase nasal afterwards reminder light sniff to keep in nares/sinuses do not drag into throat as not helpful and will only give him sore throat.  Consider soup broths today for hydration along with water.  Avoid dehydration and drink water to keep urine pale yellow clear and voiding every 2-4 hours while awake.  Patient alert and oriented x3, spoke full sentences without difficulty.  Some nasal congestion/ audible during telephone call.  No cough/throat clearing/hoarse voice/wheezing/shortness of breath during 11 minute telephone call.  Discussed with patient can contact NP Inetta Fermo through my chart when clinic closed.  Clinic reopens Tuesday 0900 8 Oct if questions or concerns Pt verbalized understanding and agreement with plan of care. No further questions/concerns at this time. Pt reminded to contact clinic with any changes in symptoms or questions/concerns. HR notified patient to work remote/quarantine through Day 5 RTW estimated Day 6 01/18/23 with strict mask wear through Day 10 10/13/224 and no eating in employee lunch room.  Discussed wash hands before going to lunch room to use microwaves or purchase food items and wear mask.  Discussed if unable to eat outside in car/outside tables speak with HR for notification available private rooms for eating inside next week.  Supervisor notified of excused absence through 17 Jan 2023.

## 2023-01-18 ENCOUNTER — Other Ambulatory Visit: Payer: Self-pay | Admitting: Primary Care

## 2023-01-18 DIAGNOSIS — E1065 Type 1 diabetes mellitus with hyperglycemia: Secondary | ICD-10-CM

## 2023-01-19 ENCOUNTER — Ambulatory Visit: Payer: No Typology Code available for payment source | Admitting: Primary Care

## 2023-01-19 ENCOUNTER — Encounter: Payer: Self-pay | Admitting: Primary Care

## 2023-01-19 VITALS — BP 132/80 | HR 96 | Temp 97.8°F | Ht 68.0 in | Wt 135.0 lb

## 2023-01-19 DIAGNOSIS — Z23 Encounter for immunization: Secondary | ICD-10-CM

## 2023-01-19 DIAGNOSIS — E1065 Type 1 diabetes mellitus with hyperglycemia: Secondary | ICD-10-CM | POA: Diagnosis not present

## 2023-01-19 DIAGNOSIS — E785 Hyperlipidemia, unspecified: Secondary | ICD-10-CM

## 2023-01-19 DIAGNOSIS — I10 Essential (primary) hypertension: Secondary | ICD-10-CM | POA: Diagnosis not present

## 2023-01-19 DIAGNOSIS — Z0000 Encounter for general adult medical examination without abnormal findings: Secondary | ICD-10-CM

## 2023-01-19 DIAGNOSIS — F419 Anxiety disorder, unspecified: Secondary | ICD-10-CM

## 2023-01-19 DIAGNOSIS — F32A Depression, unspecified: Secondary | ICD-10-CM

## 2023-01-19 MED ORDER — ATORVASTATIN CALCIUM 20 MG PO TABS
20.0000 mg | ORAL_TABLET | Freq: Every day | ORAL | 3 refills | Status: DC
Start: 2023-01-19 — End: 2024-01-25

## 2023-01-19 NOTE — Patient Instructions (Signed)
Start atorvastatin 20 mg once daily for cholesterol and protection against heart disease.  It was a pleasure to see you today!

## 2023-01-19 NOTE — Assessment & Plan Note (Signed)
Following with endocrinology through [facility], reviewed office notes from January 2024 through [facility] Everywhere. Reviewed A1C from May 2024 through employer.   Continue Semglee 20 units daily and Humalog sliding scale.

## 2023-01-19 NOTE — Assessment & Plan Note (Signed)
Has not taken atorvastatin in greater than 1 year. Lipid panel above goal, reviewed labs from May 2024 through employer.  Resume atorvastatin at 20 mg daily.  New prescription sent to pharmacy.

## 2023-01-19 NOTE — Assessment & Plan Note (Signed)
Immunizations UTD. Influenza vaccine provided today.   Discussed the importance of a healthy diet and regular exercise in order for weight loss, and to reduce the risk of further co-morbidity.  Exam stable. Labs reviewed  Follow up in 1 year for repeat physical.

## 2023-01-19 NOTE — Assessment & Plan Note (Signed)
Controlled.  Continue venlafaxine ER 150 mg daily.

## 2023-01-19 NOTE — Assessment & Plan Note (Signed)
Controlled.  Continue lisinopril 2.5 mg daily for renal protection.

## 2023-01-19 NOTE — Progress Notes (Signed)
Subjective:    Patient ID: James Stanley, male    DOB: 04/11/1981, 41 y.o.   MRN: [id number]  HPI  James Stanley is a very pleasant 43 y.o. male with a history of hypertension, uncontrolled type 1 diabetes, hyperlipidemia, anxiety and depression, high risk sexual behavior who presents today for complete physical and follow up of chronic conditions.  Immunizations: -Tetanus: Completed in 2015 -Influenza: Influenza vaccine provided today.   Diet: Fair diet.  Exercise: No regular exercise.  Eye exam: Completed 2 years ago  Dental exam: Completed 1 year ago   BP Readings from Last 3 Encounters:  01/19/23 132/80  12/07/22 90/60  08/23/22 120/84       Review of Systems  Constitutional:  Negative for unexpected weight change.  HENT:  Negative for rhinorrhea.   Respiratory:  Negative for cough and shortness of breath.   Cardiovascular:  Negative for chest pain.  Gastrointestinal:  Negative for constipation and diarrhea.  Genitourinary:  Negative for difficulty urinating.  Musculoskeletal:  Negative for arthralgias and myalgias.  Skin:  Negative for rash.  Allergic/Immunologic: Negative for environmental allergies.  Neurological:  Negative for dizziness and headaches.  Psychiatric/Behavioral:  The patient is not nervous/anxious.          Past Medical History:  Diagnosis Date   Anxiety    Chickenpox    Depression    Diabetes mellitus without complication (HCC)    Frequent headaches    Hypertension    Migraines     Social History   Socioeconomic History   Marital status: Single    Spouse name: Not on file   Number of children: Not on file   Years of education: Not on file   Highest education level: Not on file  Occupational History   Not on file  Tobacco Use   Smoking status: Never   Smokeless tobacco: Never  Substance and Sexual Activity   Alcohol use: No   Drug use: Not on file   Sexual activity: Not on file  Other Topics Concern   Not on file   Social History Narrative   Single.   Works at [employer].   Moved from [place].   Enjoys reading.   Social Determinants of Health   Financial Resource Strain: Not on file  Food Insecurity: Not on file  Transportation Needs: Not on file  Physical Activity: Not on file  Stress: Not on file  Social Connections: Not on file  Intimate Partner Violence: Not on file    History reviewed. No pertinent surgical history.  Family History  Problem Relation Age of Onset   Hyperlipidemia Mother    Mental illness Mother    Hypertension Father    Diabetes Father    Mental illness Father    Hyperlipidemia Maternal Grandmother    Heart disease Paternal Grandfather    Hypertension Paternal Grandfather    Testicular cancer Paternal Grandfather     No Known Allergies  Current Outpatient Medications on File Prior to Visit  Medication Sig Dispense Refill   busPIRone (BUSPAR) 5 MG tablet Take 1 tablet (5 mg total) by mouth 2 (two) times daily. For anxiety 60 tablet 0   Continuous Blood Gluc Sensor (FREESTYLE LIBRE 3 SENSOR) MISC Place 1 sensor on the skin every 14 days. Use to check glucose continuously 6 each 1   Glucagon 3 MG/DOSE POWD Use 1 spray in 1 nostril for severe hypoglycemia, as per package instructions     insulin lispro (HUMALOG KWIKPEN) 100  UNIT/ML KwikPen Inject 10 Units into the skin 3 (three) times daily. for diabetes. 15 mL 0   Insulin Pen Needle (PEN NEEDLES) 31G X 6 MM MISC Use four times daily with insulin. 400 each 0   lisinopril (ZESTRIL) 2.5 MG tablet TAKE 1 TABLET (2.5 MG TOTAL) BY MOUTH DAILY. FOR KIDNEY PROTECTION. 90 tablet 3   SEMGLEE, YFGN, 100 UNIT/ML Pen Inject 15 Units into the skin daily. 15 mL 0   venlafaxine XR (EFFEXOR-XR) 150 MG 24 hr capsule Take 1 capsule (150 mg total) by mouth daily with breakfast. 14 capsule 0   COVID-19 Antigen Test KIT Place 1 Device into the nose daily as needed (symptoms/exposure close contact). (Patient not taking: Reported  on 01/19/2023) 4 kit 1   No current facility-administered medications on file prior to visit.    BP 132/80   Pulse 96   Temp 97.8 F (36.6 C) (Temporal)   Ht 5\' 8"  (1.727 m)   Wt 135 lb (61.2 kg)   SpO2 97%   BMI 20.53 kg/m  Objective:   Physical Exam HENT:     Right Ear: Tympanic membrane and ear canal normal.     Left Ear: Tympanic membrane and ear canal normal.  Eyes:     Pupils: Pupils are equal, round, and reactive to light.  Cardiovascular:     Rate and Rhythm: Normal rate and regular rhythm.  Pulmonary:     Effort: Pulmonary effort is normal.     Breath sounds: Normal breath sounds.  Abdominal:     General: Bowel sounds are normal.     Palpations: Abdomen is soft.     Tenderness: There is no abdominal tenderness.  Musculoskeletal:        General: Normal range of motion.     Cervical back: Neck supple.  Skin:    General: Skin is warm and dry.  Neurological:     Mental Status: He is alert and oriented to person, place, and time.     Cranial Nerves: No cranial nerve deficit.     Deep Tendon Reflexes:     Reflex Scores:      Patellar reflexes are 2+ on the right side and 2+ on the left side. Psychiatric:        Mood and Affect: Mood normal.           Assessment & Plan:  Preventative health care Assessment & Plan: Immunizations UTD. Influenza vaccine provided today.   Discussed the importance of a healthy diet and regular exercise in order for weight loss, and to reduce the risk of further co-morbidity.  Exam stable. Labs reviewed  Follow up in 1 year for repeat physical.    Uncontrolled type 1 diabetes mellitus with hyperglycemia [facility]) Assessment & Plan: Following with endocrinology through [facility], reviewed office notes from January 2024 through [facility] Everywhere. Reviewed A1C from May 2024 through employer.   Continue Semglee 20 units daily and Humalog sliding scale.   Anxiety and depression Assessment & Plan: Controlled.  Continue  venlafaxine ER 150 mg daily.    Essential hypertension Assessment & Plan: Controlled.  Continue lisinopril 2.5 mg daily for renal protection.    Hyperlipidemia, unspecified hyperlipidemia type Assessment & Plan: Has not taken atorvastatin in greater than 1 year. Lipid panel above goal, reviewed labs from May 2024 through employer.  Resume atorvastatin at 20 mg daily.  New prescription sent to pharmacy.  Orders: -     Atorvastatin Calcium; Take 1 tablet (20 mg total) by  mouth daily. For cholesterol  Dispense: 90 tablet; Refill: 3        Doreene Nest, NP

## 2023-01-20 ENCOUNTER — Other Ambulatory Visit: Payer: Self-pay | Admitting: Primary Care

## 2023-01-20 DIAGNOSIS — F32A Depression, unspecified: Secondary | ICD-10-CM

## 2023-01-20 MED ORDER — BUSPIRONE HCL 5 MG PO TABS
5.0000 mg | ORAL_TABLET | Freq: Two times a day (BID) | ORAL | 3 refills | Status: DC
Start: 2023-01-20 — End: 2024-01-25

## 2023-01-20 MED ORDER — VENLAFAXINE HCL ER 150 MG PO CP24
150.0000 mg | ORAL_CAPSULE | Freq: Every day | ORAL | 3 refills | Status: DC
Start: 2023-01-20 — End: 2024-01-25

## 2023-01-21 NOTE — Telephone Encounter (Signed)
Patient reported feeling well returned to work as expected saw his personal doctor today and had no further questions or concerns at this time.  Wearing mask at work seen in [employer].  A&Ox3 spoke full sentences without difficulty no audible cough/congestion/throat clearing observed.  Respirations even and unlabored RA skin warm dry and pink.

## 2023-02-09 ENCOUNTER — Ambulatory Visit: Payer: Self-pay | Admitting: Registered Nurse

## 2023-02-09 ENCOUNTER — Encounter: Payer: Self-pay | Admitting: Registered Nurse

## 2023-02-09 VITALS — BP 129/94 | HR 82 | Temp 97.6°F

## 2023-02-09 DIAGNOSIS — L03317 Cellulitis of buttock: Secondary | ICD-10-CM

## 2023-02-09 MED ORDER — MUPIROCIN 2 % EX OINT: 1.0000 | TOPICAL_OINTMENT | Freq: Two times a day (BID) | CUTANEOUS | 0 refills | Status: AC

## 2023-02-09 MED ORDER — DOXYCYCLINE HYCLATE 100 MG PO TABS
100.0000 mg | ORAL_TABLET | Freq: Two times a day (BID) | ORAL | Status: AC
Start: 2023-02-09 — End: 2023-02-16

## 2023-02-09 NOTE — Patient Instructions (Signed)

## 2023-02-09 NOTE — Progress Notes (Signed)
Pt Reports a "bump" to his gluteal region x4 days. Pt reports that the bump "itches and is uncomfortable". Pt denies drainage at this time.

## 2023-02-09 NOTE — Progress Notes (Signed)
Subjective:    Patient ID: James Stanley, male    DOB: 25-Jan-1981, 42 y.o.   MRN: [id number]  42y/o caucasian male established patient here for refill mupirocin ointment ran out and thinks has infection buttock recurrent.  Still using hibiclens topical and has helped to decrease frequency of reinfections.  Denied f/c/n/v/d and stated blood sugars are improving again.  Denied discharge from buttock rash but lump getting larger and has red area.      Review of Systems  Constitutional:  Negative for chills, diaphoresis, fatigue and fever.  HENT:  Negative for trouble swallowing and voice change.   Eyes:  Negative for photophobia and visual disturbance.  Respiratory:  Negative for cough, choking, shortness of breath, wheezing and stridor.   Cardiovascular:  Negative for leg swelling.  Gastrointestinal:  Negative for abdominal pain, diarrhea, nausea and vomiting.  Genitourinary:  Negative for difficulty urinating.  Skin:  Positive for color change and rash. Negative for pallor and wound.  Neurological:  Negative for tremors, syncope, weakness and headaches.  Psychiatric/Behavioral:  Negative for agitation, confusion and sleep disturbance.        Objective:   Physical Exam Vitals and nursing note reviewed.  Constitutional:      General: He is awake. He is not in acute distress.    Appearance: Normal appearance. He is well-developed, well-groomed and normal weight. He is not ill-appearing, toxic-appearing or diaphoretic.  HENT:     Head: Normocephalic and atraumatic.     Jaw: There is normal jaw occlusion.     Salivary Glands: Right salivary gland is not diffusely enlarged. Left salivary gland is not diffusely enlarged.     Right Ear: Hearing and external ear normal.     Left Ear: Hearing and external ear normal.     Nose: Nose normal. No congestion or rhinorrhea.     Mouth/Throat:     Lips: Pink. No lesions.     Mouth: Mucous membranes are moist. No oral lesions or angioedema.      Dentition: No gum lesions.     Tongue: No lesions. Tongue does not deviate from midline.     Palate: No mass and lesions.     Pharynx: Oropharynx is clear. Uvula midline.  Eyes:     General: Lids are normal. Vision grossly intact. Gaze aligned appropriately. No scleral icterus.       Right eye: No discharge.        Left eye: No discharge.     Extraocular Movements: Extraocular movements intact.     Conjunctiva/sclera: Conjunctivae normal.     Pupils: Pupils are equal, round, and reactive to light.  Neck:     Trachea: Trachea normal.  Cardiovascular:     Rate and Rhythm: Normal rate and regular rhythm.     Pulses: Normal pulses.          Radial pulses are 2+ on the right side and 2+ on the left side.     Heart sounds: Normal heart sounds, S1 normal and S2 normal.  Pulmonary:     Effort: Pulmonary effort is normal. No respiratory distress.     Breath sounds: Normal breath sounds and air entry. No stridor or transmitted upper airway sounds. No wheezing or rales.     Comments: Spoke full sentences without difficulty; no cough observed in exam room Abdominal:     General: Abdomen is flat.  Musculoskeletal:        General: Normal range of motion.  Right hand: Normal strength. Normal capillary refill.     Left hand: Normal strength. Normal capillary refill.     Cervical back: Normal range of motion and neck supple. No swelling, edema, deformity, erythema, signs of trauma, lacerations, rigidity, spasms, torticollis, tenderness or crepitus. No pain with movement. Normal range of motion.     Thoracic back: No swelling, edema, deformity, signs of trauma, lacerations or tenderness. Normal range of motion.     Right lower leg: No edema.     Left lower leg: No edema.       Legs:     Comments: Induration/macular erythema with papules x 4 central abraded  Lymphadenopathy:     Head:     Right side of head: No submandibular or preauricular adenopathy.     Left side of head: No submandibular  or preauricular adenopathy.     Cervical:     Right cervical: No superficial cervical adenopathy.    Left cervical: No superficial cervical adenopathy.  Skin:    General: Skin is warm and dry.     Capillary Refill: Capillary refill takes less than 2 seconds.     Coloration: Skin is not ashen, cyanotic, jaundiced, mottled, pale or sallow.     Findings: Abrasion, erythema and rash present. No abscess, acne, bruising, burn, ecchymosis, signs of injury, laceration, lesion, petechiae or wound. Rash is macular, nodular and papular. Rash is not crusting, purpuric, pustular, scaling, urticarial or vesicular.     Nails: There is no clubbing.          Comments: Induration/macular erythema with papules x 4 central abraded; mildly ttp no fluctuance/discharge   Neurological:     General: No focal deficit present.     Mental Status: He is alert and oriented to person, place, and time. Mental status is at baseline.     GCS: GCS eye subscore is 4. GCS verbal subscore is 5. GCS motor subscore is 6.     Cranial Nerves: No cranial nerve deficit, dysarthria or facial asymmetry.     Motor: Motor function is intact. No weakness, tremor, atrophy, abnormal muscle tone or seizure activity.     Coordination: Coordination is intact. Coordination normal.     Gait: Gait is intact. Gait normal.     Comments: In/out of chair and on/off exam table without difficulty; gait sure and steady in clinic; bilateral hand grasp equal 5/5  Psychiatric:        Attention and Perception: Attention and perception normal.        Mood and Affect: Mood and affect normal.        Speech: Speech normal.        Behavior: Behavior normal. Behavior is cooperative.        Thought Content: Thought content normal.        Cognition and Memory: Cognition and memory normal.        Judgment: Judgment normal.      Patient refused chaperone for exam right superior buttock only examined with shifting not disrobing of complete pants/underwear      Assessment & Plan:   A-cellulitis right buttock  P-Exitcare handout on skin infection. Discussed possible side effects sunburn while on doxycycline; wear sunscreen/sun protective clothing.   Doxycycline 100mg  po BID x 7 days and will re-evaluate in one week may need to take up to 14 days #20 RF0 dispensed from [facility] to patient. Electronic Rx to his pharmacy of choice restart bactroban 2% topical BID #20gm RF0 after washing affected  area with soap and water daily until area healed.  May apply warm compress daily to help bring circulation to area.  Do not puncture affected area or scratch to get to drain; if drains on own let it but do not dig/use needle/scratch as could worsen/spread infection.  Most important is consistent daily skin care regimen/moisterizing and diabetic skin checks daily. Discussed after shower apply moisturizer/emollient and perform skin check daily.  Continue hibiclens shower daily through the summer until temperatures decrease and not sweating as much.  RTC if worsening erythema, pain, purulent discharge, fever after 48 hours on antibiotics.  May spread worsen a little over the next 24 hours to be expected but after that should be improving. Wash towels, washcloths, sheets in hot water with bleach every couple of days until infection resolved. Wash area with soap and water at least daily. Avoid scratching.  Patient verbalized understanding, agreed with plan of care and had no further questions at this time.

## 2023-02-28 ENCOUNTER — Other Ambulatory Visit: Payer: Self-pay | Admitting: Family Medicine

## 2023-03-10 ENCOUNTER — Telehealth: Payer: No Typology Code available for payment source | Admitting: Physician Assistant

## 2023-03-10 DIAGNOSIS — R21 Rash and other nonspecific skin eruption: Secondary | ICD-10-CM | POA: Diagnosis not present

## 2023-03-10 MED ORDER — NYSTATIN 100000 UNIT/GM EX CREA: 1.0000 | TOPICAL_CREAM | Freq: Two times a day (BID) | CUTANEOUS | 0 refills | Status: DC

## 2023-03-10 NOTE — Progress Notes (Signed)
E Visit for Rash  We are sorry that you are not feeling well. Here is how we plan to help!  Based upon your presentation it appears you have a fungal infection.  I have prescribed: and Nystatin cream apply to the affected area twice daily  I feel like based on the picture shown this appears to be more of a fungal infection than cellulitis.    HOME CARE:  Take cool showers and avoid direct sunlight. Apply cool compress or wet dressings. Take a bath in an oatmeal bath.  Sprinkle content of one Aveeno packet under running faucet with comfortably warm water.  Bathe for 15-20 minutes, 1-2 times daily.  Pat dry with a towel. Do not rub the rash. Use hydrocortisone cream. Take an antihistamine like Benadryl for widespread rashes that itch.  The adult dose of Benadryl is 25-50 mg by mouth 4 times daily. Caution:  This type of medication may cause sleepiness.  Do not drink alcohol, drive, or operate dangerous machinery while taking antihistamines.  Do not take these medications if you have prostate enlargement.  Read package instructions thoroughly on all medications that you take.  GET HELP RIGHT AWAY IF:  Symptoms don't go away after treatment. Severe itching that persists. If you rash spreads or swells. If you rash begins to smell. If it blisters and opens or develops a yellow-brown crust. You develop a fever. You have a sore throat. You become short of breath.  MAKE SURE YOU:  Understand these instructions. Will watch your condition. Will get help right away if you are not doing well or get worse.  Thank you for choosing an e-visit.  Your e-visit answers were reviewed by a board certified advanced clinical practitioner to complete your personal care plan. Depending upon the condition, your plan could have included both over the counter or prescription medications.  Please review your pharmacy choice. Make sure the pharmacy is open so you can pick up prescription now. If there is a  problem, you may contact your provider through [employer] and have the prescription routed to another pharmacy.  Your safety is important to [place]. If you have drug allergies check your prescription carefully.   For the next 24 hours you can use MyChart to ask questions about today's visit, request a non-urgent call back, or ask for a work or school excuse. You will get an email in the next two days asking about your experience. I hope that your e-visit has been valuable and will speed your recovery.   I have spent 5 minutes in review of e-visit questionnaire, review and updating patient chart, medical decision making and response to patient.   Margaretann Loveless, PA-C

## 2023-04-08 ENCOUNTER — Other Ambulatory Visit: Payer: Self-pay | Admitting: Family Medicine

## 2023-04-10 ENCOUNTER — Other Ambulatory Visit: Payer: Self-pay | Admitting: Family Medicine

## 2023-04-26 ENCOUNTER — Telehealth: Payer: Self-pay | Admitting: Registered Nurse

## 2023-04-26 DIAGNOSIS — Z20822 Contact with and (suspected) exposure to covid-19: Secondary | ICD-10-CM

## 2023-04-26 DIAGNOSIS — U071 COVID-19: Secondary | ICD-10-CM

## 2023-04-26 MED ORDER — MOLNUPIRAVIR EUA 200MG CAPSULE: 4.0000 | ORAL_CAPSULE | Freq: Two times a day (BID) | ORAL | 0 refills | Status: AC

## 2023-04-26 MED ORDER — COVID-19 ANTIGEN TEST VI KIT
1.0000 | PACK | Freq: Every day | 1 refills | Status: AC | PRN
Start: 2023-04-26 — End: ?

## 2023-04-26 NOTE — Telephone Encounter (Signed)
 Notified by patient home covid test positive after work today.  Denied close contacts at work e.g. 6 feet longer than 15 minutes.  Discussed will contact him via telephone tomorrow for re-evaluation.  Rx molnupiravir  sent to his pharmacy as does want antivirals.  Quarantine at home today and tomorrow.  If no diarrhea/vomiting/fever greater than 100.58F off antipyretics tomorrow may RTW onsite with mask 04/28/23 through 05/06/23.  No eating in employee lunch room through 05/06/23.  Supervisor and HR notified of excused absence for today and tomorrow.. Patient to contact clinic staff if vomiting after coughing or unable to tolerate po fluids.  Discussed flu and other viral illnesses circulating in community and some causing GI upset.  If GI upset I have recommended clear fluids then bland diet.  Avoid dairy/spicy, fried and large portions of meat while having nausea.  If vomiting hold po intake x 1 hour.  Then sips clear fluids like broths, ginger ale, power ade, gatorade, pedialyte may advance to soft/bland if no vomiting x 24 hours and appetite returned otherwise hydration main focus. Call me at work from home number if symptoms not improved with plan of care  patient to call if high fever, dehydration, marked weakness, fainting, increased abdominal pain, blood in stool or vomit (red or black).     possible Covid symptoms including cough, shortness of breath with exertion or at rest, runny nose, congestion, sinus pain/pressure, sore throat, fever/chills, body aches, fatigue, loss of taste/smell, GI symptoms of nausea/vomiting/diarrhea. emergent eval/ER precautions if dizziness/syncope, confusion, blue tint to lips/face, severe shortness of breath/difficulty breathing/wheezing.     Patient to isolate in own room and if possible use only one bathroom if living with others in home.  Wear mask when out of room to help prevent spread to others in household.  Sanitize high touch surfaces with lysol/chlorox/bleach spray or  wipes daily as viruses are known to live on surfaces from 24 hours to days.  Patient does want antivirals.  Patient at higher risk for hospitalization due to autoimmune disease/diabetes.  Patient is not up to date on covid vaccines. Recommend annual booster or to start series 60 days after infection resolution.  Patient is  on prescription medications or daily medications. If taking medications epocrates interaction checker used to verify if any drug interactions. Patient molnupiravir  emergency use handout sent to patient electronically along with covid quarantine exitcare handout in my chart  discussed take molnupiravir  400mg  take 4 tabs by mouth every 12 hours x 5 days. Discussed lemonade can sometimes help with metallic/plastic taste in mouth (side effect medication).  Discussed most common side effects GI upset and bad taste in mouth.  Use birth control/avoid getting pregnant while on molnupiravir  and no breastfeeding.  Discussed I recommended not having sex with anyone while sick/testing positive/10 day quarantine as could spread virus to partner.  Discussed with patient I would call again tomorrow to follow up symptoms/see if questions/concerns. Discussed I am traveling this weekend Fri-Monday and use PCM/urgent care if new or worsening symptoms for re-evaluation.  Discussed RN Wallene Gum in clinic tomorrow 8a-5p if questions/concerns [phone]    May use salt water gargles and nasal saline 2 sprays each nostril q2h prn congestion/sore throat.  Research has shown it helps to prevent hospitalizations and decrease discomfort.   May use flonase  nasal 50mcg 1 spray each nostril BID prn rhinitis.  honey 1 tablespoon every 4 hours is a natural cough suppressant but caution due to his diabetes.  Avoid dehydration and drink water to  keep urine pale yellow clear and voiding every 2-4 hours while awake.  Patient alert and oriented x3, spoke full sentences without difficulty.Pt verbalized understanding and agreement with plan  of care. No further questions/concerns at this time. Pt reminded to contact clinic with any changes in symptoms or questions/concerns.

## 2023-04-28 NOTE — Telephone Encounter (Signed)
Message received from patient 04/27/22 am but left on 04/27/23  fever 101 yesterday not scheduled to work weekend days  "Here are the results, I did two and both came up like that. Sorry if I missed your call, I took a rather long nap and phone died. My head has been annoyingly tight, slight cough, and most my temp went was 101 so far.   Thank you for the tips, and I appreciate the help. And yes please the antivirals if possible. "  Returned patient call left message Rx was sent to your pharmacy  [facility] [address] on 04/26/23.  Avoid dehydration drink water to keep your urine pale yellow clear and voiding every 2-4 hours when awake.  Continue to quarantine/rest.  Excused absence extended through 04/30/23 HR and supervisor notified today.  I will be contacting you on 04/30/23 via telephone for re-evaluation.  RN Rosalita Chessman in clinic 04/2023 [phone].  I am in clinic Tues 09a-12p and Thur 11a-2p next week."  Continue plan of care as previously discussed.

## 2023-05-01 NOTE — Telephone Encounter (Signed)
Patient left message for NP feeling better tested negative yesterday.  Positive test on Saturday.  Wearing mask at work today feeling better symptoms improved.  Still has cough, nasal congestion and brother tested positive for covid on Saturday also.  Patient aware to wear mask at work this week until 2 negative tests and send picture of results to me and no eating in employee lunch room.  Recommended to wear mask if still having cough when around others.  Patient agreed with plan of care and had no further questions at this time.  HR and supervisor notified cleared to be onsite.

## 2023-05-02 NOTE — Telephone Encounter (Signed)
Patient seen in workcenter wearing disposable surgical mask A&Ox3 spoke full sentences without difficulty skin warm dry and pink respirations even and unlabored RA BBS CTA Sp02 98% RA no cough/congestion observed feeling well denied concerns.  Reiterated 10 day mask wear at work and no eating in employee lunch room.  Patient agreed with plan of care and had no further questions at this time.

## 2023-05-08 ENCOUNTER — Telehealth: Payer: Self-pay | Admitting: Registered Nurse

## 2023-05-08 ENCOUNTER — Encounter: Payer: Self-pay | Admitting: Registered Nurse

## 2023-05-08 NOTE — Telephone Encounter (Signed)
Patient asked if [facility] labs can be drawn at [facility] clinic and if asymptomatic STI testing can be performed.  Per clinic contract unable to perform PAPs, rx controlled substances or mental health medications, write work restrictions that don't meet communicable disease policy or perform STI testing   Patient notified.

## 2023-05-09 ENCOUNTER — Other Ambulatory Visit: Payer: Self-pay | Admitting: Primary Care

## 2023-05-09 DIAGNOSIS — E785 Hyperlipidemia, unspecified: Secondary | ICD-10-CM

## 2023-05-09 DIAGNOSIS — I10 Essential (primary) hypertension: Secondary | ICD-10-CM

## 2023-06-01 NOTE — Telephone Encounter (Signed)
Patient seen in workcenter stated feeling well all symptoms resolved denied concerns.  A&Ox3 spoke full sentences without difficulty skin warm dry and pink no audible nasal congestion/cough or throat clearing observed respirations even and unlabored RA

## 2023-10-24 ENCOUNTER — Telehealth: Payer: Self-pay | Admitting: Registered Nurse

## 2023-10-24 DIAGNOSIS — Z0000 Encounter for general adult medical examination without abnormal findings: Secondary | ICD-10-CM

## 2023-10-25 NOTE — Telephone Encounter (Signed)
 Patient replied to clinic had not had labs with [facility] and will schedule with RN Apolinar.  Message left for patient yesterday at work Selinda,  The Be Well program ends 31 Jul for insurance discount starting 10 Jan 2024.  If you are having labs drawn at [facility] please contact Apolinar today to schedule for tomorrow,  21 or 22 July at [email or web address] or [phone] or if labs drawn at another provider office see her to complete your paperwork.  If you are not planning to use [employer] insurance can you please let us  know also?  Sincerely,  Ellouise Hope NP-C

## 2023-10-30 ENCOUNTER — Other Ambulatory Visit

## 2023-11-03 NOTE — Telephone Encounter (Signed)
 Patient has labs scheduled with RN next week Tuesday

## 2023-11-07 ENCOUNTER — Other Ambulatory Visit

## 2023-11-08 ENCOUNTER — Other Ambulatory Visit: Payer: Self-pay | Admitting: Registered Nurse

## 2023-11-08 ENCOUNTER — Other Ambulatory Visit: Payer: Self-pay

## 2023-11-08 DIAGNOSIS — Z0000 Encounter for general adult medical examination without abnormal findings: Secondary | ICD-10-CM

## 2023-11-08 NOTE — Progress Notes (Signed)
 Arrived for Be well lab and form completion

## 2023-11-09 NOTE — Telephone Encounter (Signed)
 Results copied from [facility] portal and my chart message with results sent to patient   James Stanley,   Your blood pressure and LDL met requirements for Be Well insurance discount starting 10 Jan 2024.  [employer] paperwork was given to HR Jen today.  Hgba1c elevated 11.1  I recommend follow up with your [facility] and endocrinology.  A few test results are still pending will send once available.  Phosphorus, T3 thyroid hormone update and uric acid levels slightly low and absolute lymphocytes(infection fighting cells) slightly elevated.  Have you been sick lately?  Cholesterol has improved quite a bit since last year and A1c improved 0.6 points.  Continue working on your blood sugar levels to help prevent organ/nerve damage.  You may come to clinic if you prefer printed copy of results or to notify us  to send results to provider.   Please let us  know if you have further questions or concerns.   Sincerely,   Ellouise Hope NP-C Glucose  124 High mg/dL 70 - 99 Uric Acid  2.4                           Therapeutic target for gout patients: <6.0 Low mg/dL 3.8 - 8.4 BUN  5 Low mg/dL 6 - 24 Creatinine  9.23 mg/dL 9.23 - 8.72 eGFR  883 fO/fpw/8.26  >59 BUN/Creatinine Ratio  7 Low  9 - 20 Sodium  136 mmol/L 134 - 144 Potassium  4.0 mmol/L 3.5 - 5.2 Chloride  97 mmol/L 96 - 106 Calcium   9.4 mg/dL 8.7 - 89.7 Phosphorus Will Follow    Protein, Total  6.9 g/dL 6.0 - 8.5 Albumin  4.7 g/dL 4.1 - 5.1 Globulin, Total  2.2 g/dL 1.5 - 4.5 Bilirubin, Total  0.2 mg/dL 0.0 - 1.2 Alkaline Phosphatase  95 IU/L 44 - 121 LDH Will Follow    AST (SGOT)  15 IU/L 0 - 40 ALT (SGPT)  10 IU/L 0 - 44 GGT Will Follow    Iron  90 ug/dL 38 - 830 .  Lipids  Cholesterol, Total  181 mg/dL 899 - 800 Triglycerides  61 mg/dL 0 - 850 HDL Cholesterol  77 mg/dL  >60 VLDL Cholesterol Cal  12 mg/dL 5 - 40 LDL Chol Calc (NIH)  92 mg/dL 0 - 99 T. Chol/HDL Ratio  2.4 ratio 0.0 - 5.0 Please Note:                                                    T. Chol/HDL Ratio                                                            Men  Women                                              1/2 Avg.Risk  3.4    3.3  Avg.Risk  5.0    4.4                                               2X Avg.Risk  9.6    7.1                                               3X Avg.Risk 23.4   11.0 Estimated CHD Risk  < 0.5                The CHD Risk is based on the T. Chol/HDL ratio. Other                factors affect CHD Risk such as hypertension, smoking,                diabetes, severe obesity, and family history of                premature CHD. times avg. 0.0 - 1.0 .  Thyroid  TSH  1.710 uIU/mL 0.450 - 4.500 Thyroxine (T4)  8.5 ug/dL 4.5 - 87.9 T3 Uptake  21 Low % 24 - 39 Free Thyroxine Index  1.8 1.2 - 4.9 .  CBC, Platelet Ct, and Diff  WBC  8.3 x10E3/uL 3.4 - 10.8 RBC  4.80 x10E6/uL 4.14 - 5.80 Hemoglobin  14.9 g/dL 86.9 - 82.2 Hematocrit  45.6 % 37.5 - 51.0 MCV  95 fL 79 - 97 MCH  31.0 pg 26.6 - 33.0 MCHC  32.7 g/dL 68.4 - 64.2 RDW  87.4 % 11.6 - 15.4 Platelets  343 x10E3/uL 150 - 450 Neutrophils  53 %  Not Estab. Lymphs  38 %  Not Estab. Monocytes  7 %  Not Estab. Eos  1 %  Not Estab. Basos  1 %  Not Estab. Neutrophils (Absolute)  4.4 x10E3/uL 1.4 - 7.0 Lymphs (Absolute)  3.2 High x10E3/uL 0.7 - 3.1 Monocytes(Absolute)  0.5 x10E3/uL 0.1 - 0.9 Eos (Absolute)  0.1 x10E3/uL 0.0 - 0.4 Baso (Absolute)  0.1 x10E3/uL 0.0 - 0.2 Immature Granulocytes  0 %  Not Estab. Immature Grans (Abs)  0.0 x10E3/uL 0.0 - 0.1 Hemoglobin A1c (#[id number]) Test Results Flag Units Reference Interval Hemoglobin A1c  11.1 High % 4.8 - 5.6 Please Note:                                                         .          Prediabetes: 5.7 - 6.4          Diabetes: >6.4          Glycemic control for adults with diabetes:  <7.0

## 2023-11-10 ENCOUNTER — Ambulatory Visit: Payer: Self-pay | Admitting: Registered Nurse

## 2023-11-10 LAB — CMP12+LP+TP+TSH+6AC+CBC/D/PLT
ALT: 10 IU/L (ref 0–44)
AST: 15 IU/L (ref 0–40)
Albumin: 4.7 g/dL (ref 4.1–5.1)
Alkaline Phosphatase: 95 IU/L (ref 44–121)
BUN/Creatinine Ratio: 7 — ABNORMAL LOW (ref 9–20)
BUN: 5 mg/dL — ABNORMAL LOW (ref 6–24)
Basophils Absolute: 0.1 x10E3/uL (ref 0.0–0.2)
Basos: 1 %
Bilirubin Total: 0.2 mg/dL (ref 0.0–1.2)
Calcium: 9.4 mg/dL (ref 8.7–10.2)
Chloride: 97 mmol/L (ref 96–106)
Chol/HDL Ratio: 2.4 ratio (ref 0.0–5.0)
Cholesterol, Total: 181 mg/dL (ref 100–199)
Creatinine, Ser: 0.76 mg/dL (ref 0.76–1.27)
EOS (ABSOLUTE): 0.1 x10E3/uL (ref 0.0–0.4)
Eos: 1 %
Estimated CHD Risk: <0.5 times avg. (ref 0.0–1.0)
Free Thyroxine Index: 1.8 (ref 1.2–4.9)
GGT: 25 IU/L (ref 0–65)
Globulin, Total: 2.2 g/dL (ref 1.5–4.5)
Glucose: 124 mg/dL — ABNORMAL HIGH (ref 70–99)
HDL: 77 mg/dL (ref >39)
Hematocrit: 45.6 % (ref 37.5–51.0)
Hemoglobin: 14.9 g/dL (ref 13.0–17.7)
Immature Grans (Abs): 0 x10E3/uL (ref 0.0–0.1)
Immature Granulocytes: 0 %
Iron: 90 ug/dL (ref 38–169)
LDH: 180 IU/L (ref 121–224)
LDL Chol Calc (NIH): 92 mg/dL (ref 0–99)
Lymphocytes Absolute: 3.2 x10E3/uL — ABNORMAL HIGH (ref 0.7–3.1)
Lymphs: 38 %
MCH: 31 pg (ref 26.6–33.0)
MCHC: 32.7 g/dL (ref 31.5–35.7)
MCV: 95 fL (ref 79–97)
Monocytes Absolute: 0.5 x10E3/uL (ref 0.1–0.9)
Monocytes: 7 %
Neutrophils Absolute: 4.4 x10E3/uL (ref 1.4–7.0)
Neutrophils: 53 %
Phosphorus: 3.9 mg/dL (ref 2.8–4.1)
Platelets: 343 x10E3/uL (ref 150–450)
Potassium: 4 mmol/L (ref 3.5–5.2)
RBC: 4.8 x10E6/uL (ref 4.14–5.80)
RDW: 12.5 % (ref 11.6–15.4)
Sodium: 136 mmol/L (ref 134–144)
T3 Uptake Ratio: 21 % — ABNORMAL LOW (ref 24–39)
T4, Total: 8.5 ug/dL (ref 4.5–12.0)
TSH: 1.71 u[IU]/mL (ref 0.450–4.500)
Total Protein: 6.9 g/dL (ref 6.0–8.5)
Triglycerides: 61 mg/dL (ref 0–149)
Uric Acid: 2.4 mg/dL — ABNORMAL LOW (ref 3.8–8.4)
VLDL Cholesterol Cal: 12 mg/dL (ref 5–40)
WBC: 8.3 x10E3/uL (ref 3.4–10.8)
eGFR: 116 mL/min/1.73 (ref >59)

## 2023-11-10 LAB — HGB A1C W/O EAG: Hgb A1c MFr Bld: 11.1 % — ABNORMAL HIGH (ref 4.8–5.6)

## 2023-11-15 NOTE — Telephone Encounter (Signed)
 [employer] form completed and given to HR Jen 11/14/23 met requirements for insurance discount starting 10 Jan 2024 and NP completed provider portion Be Well 2026 paperwork.

## 2023-11-30 NOTE — Telephone Encounter (Signed)
 Results discussed with patient in detail and he stated he has follow up scheduled with endocrinology and PCM for cholesterol and Hgba1c

## 2024-01-25 ENCOUNTER — Other Ambulatory Visit: Payer: Self-pay | Admitting: Primary Care

## 2024-01-25 DIAGNOSIS — F32A Depression, unspecified: Secondary | ICD-10-CM

## 2024-01-25 DIAGNOSIS — E785 Hyperlipidemia, unspecified: Secondary | ICD-10-CM

## 2024-01-25 DIAGNOSIS — E1065 Type 1 diabetes mellitus with hyperglycemia: Secondary | ICD-10-CM

## 2024-01-25 NOTE — Telephone Encounter (Signed)
 Patient is due for CPE/follow up, this will be required prior to any further refills.  Please schedule, thank you!

## 2024-02-09 NOTE — Telephone Encounter (Signed)
Lvm to schedule CPE

## 2024-02-27 ENCOUNTER — Other Ambulatory Visit: Payer: Self-pay | Admitting: Primary Care

## 2024-02-27 DIAGNOSIS — E1065 Type 1 diabetes mellitus with hyperglycemia: Secondary | ICD-10-CM

## 2024-02-27 DIAGNOSIS — E785 Hyperlipidemia, unspecified: Secondary | ICD-10-CM

## 2024-02-27 DIAGNOSIS — F419 Anxiety disorder, unspecified: Secondary | ICD-10-CM

## 2024-02-28 NOTE — Telephone Encounter (Signed)
 Patient is due for CPE/follow up, this will be required prior to any further refills.  Please schedule, thank you!

## 2024-02-29 NOTE — Telephone Encounter (Signed)
 Called  schedule appt mb full

## 2024-03-02 ENCOUNTER — Other Ambulatory Visit: Payer: Self-pay | Admitting: Primary Care

## 2024-03-02 DIAGNOSIS — E1065 Type 1 diabetes mellitus with hyperglycemia: Secondary | ICD-10-CM

## 2024-03-02 DIAGNOSIS — F32A Depression, unspecified: Secondary | ICD-10-CM

## 2024-03-02 DIAGNOSIS — E785 Hyperlipidemia, unspecified: Secondary | ICD-10-CM

## 2024-03-18 ENCOUNTER — Telehealth: Payer: Self-pay | Admitting: Internal Medicine

## 2024-03-18 DIAGNOSIS — F419 Anxiety disorder, unspecified: Secondary | ICD-10-CM

## 2024-03-18 MED ORDER — ATORVASTATIN CALCIUM 20 MG PO TABS: 20.0000 mg | ORAL_TABLET | Freq: Every day | ORAL | 0 refills | Status: DC

## 2024-03-18 MED ORDER — LISINOPRIL 2.5 MG PO TABS: 2.5000 mg | ORAL_TABLET | Freq: Every day | ORAL | 0 refills | Status: DC

## 2024-03-18 MED ORDER — VENLAFAXINE HCL ER 150 MG PO CP24: 150.0000 mg | ORAL_CAPSULE | Freq: Every day | ORAL | 0 refills | Status: DC

## 2024-03-18 MED ORDER — BUSPIRONE HCL 5 MG PO TABS: 5.0000 mg | ORAL_TABLET | Freq: Two times a day (BID) | ORAL | 0 refills | Status: DC

## 2024-03-18 NOTE — Telephone Encounter (Unsigned)
 Copied from CRM [id number]. Topic: Clinical - Medication Refill >> Mar 18, 2024 12:17 PM Emylou G wrote: Medication: venlafaxine  XR (EFFEXOR -XR) 150 MG 24 hr capsule  Has the patient contacted their pharmacy? Yes (Agent: If no, request that the patient contact the pharmacy for the refill. If patient does not wish to contact the pharmacy document the reason why and proceed with request.) (Agent: If yes, when and what did the pharmacy advise?) said to call us   This is the patient's preferred pharmacy:  [id number] [place], [place] - [address] [place] [address] Phone: [phone] Fax: [phone]  Is this the correct pharmacy for this prescription? Yes If no, delete pharmacy and type the correct one.   Has the prescription been filled recently? No  Is the patient out of the medication? Yes  Has the patient been seen for an appointment in the last year OR does the patient have an upcoming appointment? Yes  Can we respond through MyChart? Yes  Agent: Please be advised that Rx refills may take up to 3 business days. We ask that you follow-up with your pharmacy.

## 2024-03-19 NOTE — Telephone Encounter (Signed)
Refill already sent to pharmacy.

## 2024-03-24 ENCOUNTER — Telehealth

## 2024-03-24 DIAGNOSIS — A084 Viral intestinal infection, unspecified: Secondary | ICD-10-CM | POA: Diagnosis not present

## 2024-03-24 MED ORDER — ONDANSETRON HCL 4 MG PO TABS: 4.0000 mg | ORAL_TABLET | Freq: Three times a day (TID) | ORAL | 0 refills | Status: DC | PRN

## 2024-03-24 NOTE — Progress Notes (Signed)
 We are sorry that you are not feeling well. Here is how we plan to help!  Based on what you have shared with me it looks like you have a Virus that is irritating your GI tract.  Vomiting is the forceful emptying of a portion of the stomach's content through the mouth.  Although nausea and vomiting can make you feel miserable, it's important to remember that these are not diseases, but rather symptoms of an underlying illness.  When we treat short term symptoms, we always caution that any symptoms that persist should be fully evaluated in a medical office.  I have prescribed a medication that will help alleviate your symptoms and allow you to stay hydrated:  Zofran  4 mg 1 tablet every 8 hours as needed for nausea and vomiting  HOME CARE: Drink clear liquids.  This is very important! Dehydration (the lack of fluid) can lead to a serious complication.  Start off with 1 tablespoon every 5 minutes for 8 hours. You may begin eating bland foods after 8 hours without vomiting.  Start with saltine crackers, white bread, rice, mashed potatoes, applesauce. After 48 hours on a bland diet, you may resume a normal diet. Try to go to sleep.  Sleep often empties the stomach and relieves the need to vomit.  GET HELP RIGHT AWAY IF:  Your symptoms do not improve or worsen within 2 days after treatment. You have a fever for over 3 days. You cannot keep down fluids after trying the medication.  MAKE SURE YOU:  Understand these instructions. Will watch your condition. Will get help right away if you are not doing well or get worse.   Thank you for choosing an e-visit. Your e-visit answers were reviewed by a board certified advanced clinical practitioner to complete your personal care plan. Depending upon the condition, your plan could have included both over the counter or prescription medications. Please review your pharmacy choice. Be sure that the pharmacy you have chosen is open so that you can pick up  your prescription now.  If there is a problem you may message your provider in MyChart to have the prescription routed to another pharmacy. Your safety is important to us . If you have drug allergies check your prescription carefully.  For the next 24 hours, you can use MyChart to ask questions about today's visit, request a non-urgent call back, or ask for a work or school excuse from your e-visit provider. You will get an e-mail in the next two days asking about your experience. I hope that your e-visit has been valuable and will speed your recovery.  I have spent 5 minutes in review of e-visit questionnaire, review and updating patient chart, medical decision making and response to patient.   Bari Learn, FNP

## 2024-03-28 ENCOUNTER — Ambulatory Visit: Admitting: Primary Care

## 2024-03-28 VITALS — BP 120/80 | HR 107 | Temp 98.0°F | Ht 67.75 in | Wt 124.5 lb

## 2024-03-28 DIAGNOSIS — E1065 Type 1 diabetes mellitus with hyperglycemia: Secondary | ICD-10-CM | POA: Diagnosis not present

## 2024-03-28 DIAGNOSIS — E785 Hyperlipidemia, unspecified: Secondary | ICD-10-CM | POA: Diagnosis not present

## 2024-03-28 DIAGNOSIS — F32A Depression, unspecified: Secondary | ICD-10-CM | POA: Diagnosis not present

## 2024-03-28 DIAGNOSIS — F419 Anxiety disorder, unspecified: Secondary | ICD-10-CM

## 2024-03-28 DIAGNOSIS — Z23 Encounter for immunization: Secondary | ICD-10-CM

## 2024-03-28 DIAGNOSIS — I10 Essential (primary) hypertension: Secondary | ICD-10-CM

## 2024-03-28 DIAGNOSIS — Z0000 Encounter for general adult medical examination without abnormal findings: Secondary | ICD-10-CM | POA: Diagnosis not present

## 2024-03-28 NOTE — Assessment & Plan Note (Signed)
 Tetanus vaccine provided today  Discussed the importance of a healthy diet and regular exercise in order for weight loss, and to reduce the risk of further co-morbidity.  Exam stable. Labs reviewed   Follow up in 1 year for repeat physical.

## 2024-03-28 NOTE — Patient Instructions (Signed)
 Please schedule a physical to meet with me in 1-2 months.   It was a pleasure to see you today!

## 2024-03-28 NOTE — Assessment & Plan Note (Addendum)
 Controlled.  Continue to monitor. Continue lisinopril  2.5 mg for renal protection.   CMP reviewed from July 2025

## 2024-03-28 NOTE — Assessment & Plan Note (Signed)
 Continue atorvastatin  20 mg daily, reviewed lipid panel from July 2025

## 2024-03-28 NOTE — Progress Notes (Signed)
 Subjective:    Patient ID: James Stanley, male    DOB: 1980-11-28, 43 y.o.   MRN: [id number]  James Stanley is a very pleasant 43 y.o. male who presents today for complete physical and follow up of chronic conditions.  Today he brings paperwork requesting intermittent FMLA due to missed work days. He misses work when his glucose levels are too high which cause him a reduced appetite, nausea. It takes time to lower his glucose readings. he was recently out of work due to abdominal symptoms of nausea, vomiting, diarrhea which lasted from 12/09-12/12. He missed work those dates and also 11/19-11/20. He is requesting to have 2 days per month, 8 hours each time.   Immunizations: -Tetanus: Completed in 2015 -Influenza: Influenza vaccine provided today.   Diet: Fair diet.  Exercise: No regular exercise.  Eye exam: Completed > 1 year ago  Dental exam: Completed > 1 year ago  BP Readings from Last 3 Encounters:  03/28/24 120/80  02/09/23 (!) 129/94  01/19/23 132/80       Review of Systems  Constitutional:  Negative for unexpected weight change.  HENT:  Negative for rhinorrhea.   Respiratory:  Negative for cough and shortness of breath.   Cardiovascular:  Negative for chest pain.  Gastrointestinal:  Negative for constipation and diarrhea.  Genitourinary:  Negative for difficulty urinating.  Musculoskeletal:  Negative for arthralgias and myalgias.  Skin:  Negative for rash.  Allergic/Immunologic: Negative for environmental allergies.  Neurological:  Negative for dizziness, numbness and headaches.  Psychiatric/Behavioral:  The patient is not nervous/anxious.          Past Medical History:  Diagnosis Date   Anxiety    Chickenpox    Depression    Diabetes mellitus without complication (HCC)    Frequent headaches    Hypertension    Migraines    Skin irritation 05/03/2019    Social History   Socioeconomic History   Marital status: Single    Spouse name: Not on file    Number of children: Not on file   Years of education: Not on file   Highest education level: Bachelor's degree (e.g., BA, AB, BS)  Occupational History   Not on file  Tobacco Use   Smoking status: Never   Smokeless tobacco: Never  Substance and Sexual Activity   Alcohol use: Never   Drug use: Not Currently    Types: Marijuana   Sexual activity: Not Currently  Other Topics Concern   Not on file  Social History Narrative   Single.   Works at [employer].   Moved from [place].   Enjoys reading.   Social Drivers of Health   Tobacco Use: Low Risk (03/28/2024)   Patient History    Smoking Tobacco Use: Never    Smokeless Tobacco Use: Never    Passive Exposure: Not on file  Financial Resource Strain: High Risk (03/28/2024)   Overall Financial Resource Strain (CARDIA)    Difficulty of Paying Living Expenses: Hard  Food Insecurity: Food Insecurity Present (03/28/2024)   Epic    Worried About Programme Researcher, Broadcasting/film/video in the Last Year: Often true    Ran Out of Food in the Last Year: Sometimes true  Transportation Needs: Unmet Transportation Needs (03/28/2024)   Epic    Lack of Transportation (Medical): Yes    Lack of Transportation (Non-Medical): Yes  Physical Activity: Sufficiently Active (03/28/2024)   Exercise Vital Sign    Days of Exercise per Week: 7 days  Minutes of Exercise per Session: 40 min  Stress: Stress Concern Present (03/28/2024)   [employer] of Occupational Health - Occupational Stress Questionnaire    Feeling of Stress: Very much  Social Connections: Unknown (03/28/2024)   Social Connection and Isolation Panel    Frequency of Communication with Friends and Family: Patient declined    Frequency of Social Gatherings with Friends and Family: Twice a week    Attends Religious Services: Patient declined    Database Administrator or Organizations: Yes    Attends Banker Meetings: 1 to 4 times per year    Marital Status: Never married   Intimate Partner Violence: Not on file  Depression (PHQ2-9): Medium Risk (03/28/2024)   Depression (PHQ2-9)    PHQ-2 Score: 6  Alcohol Screen: Not on file  Housing: High Risk (03/28/2024)   Epic    Unable to Pay for Housing in the Last Year: Yes    Number of Times Moved in the Last Year: Not on file    Homeless in the Last Year: No  Utilities: Not on file  Health Literacy: Not on file    History reviewed. No pertinent surgical history.  Family History  Problem Relation Age of Onset   Hyperlipidemia Mother    Mental illness Mother    Depression Mother    Anxiety disorder Mother    Hypertension Father    Diabetes Father    Mental illness Father    Hyperlipidemia Maternal Grandmother    Heart disease Paternal Grandfather    Hypertension Paternal Grandfather    Testicular cancer Paternal Grandfather    Alcohol abuse Brother     Allergies[1]  Medications Ordered Prior to Encounter[2]  BP 120/80   Pulse (!) 107   Temp 98 F (36.7 C) (Oral)   Ht 5' 7.75 (1.721 m)   Wt 124 lb 8 oz (56.5 kg)   SpO2 99%   BMI 19.07 kg/m  Objective:   Physical Exam HENT:     Right Ear: Tympanic membrane and ear canal normal.     Left Ear: Tympanic membrane and ear canal normal.  Eyes:     Pupils: Pupils are equal, round, and reactive to light.  Cardiovascular:     Rate and Rhythm: Normal rate and regular rhythm.  Pulmonary:     Effort: Pulmonary effort is normal.     Breath sounds: Normal breath sounds.  Abdominal:     General: Bowel sounds are normal.     Palpations: Abdomen is soft.     Tenderness: There is no abdominal tenderness.  Musculoskeletal:        General: Normal range of motion.     Cervical back: Neck supple.  Skin:    General: Skin is warm and dry.  Neurological:     Mental Status: He is alert and oriented to person, place, and time.     Cranial Nerves: No cranial nerve deficit.     Deep Tendon Reflexes:     Reflex Scores:      Patellar reflexes are 2+ on  the right side and 2+ on the left side. Psychiatric:        Mood and Affect: Mood normal.     Physical Exam        Assessment & Plan:  Need for influenza vaccination -     Flu vaccine trivalent PF, 6mos and older(Flulaval,Afluria,Fluarix,Fluzone)  Essential hypertension Assessment & Plan: Controlled.  Continue to monitor. Continue lisinopril  2.5 mg for renal protection.  CMP reviewed from July 2025   Uncontrolled type 1 diabetes mellitus with hyperglycemia (HCC) Assessment & Plan: Uncontrolled based on labs from July 2025. Following with endocrinology in [facility] in [place].  Continue Semglee  20 units daily and Humalog  sliding scale.   Agree to complete intermittent FMLA per patient request. Will allow 2 days/month, 8 hours each day.  Orders: -     Ambulatory referral to Optometry  Anxiety and depression Assessment & Plan: Controlled.  Continue venlafaxine  ER 150 mg daily, buspirone  5 mg twice daily   Hyperlipidemia, unspecified hyperlipidemia type Assessment & Plan: Continue atorvastatin  20 mg daily, reviewed lipid panel from July 2025   Preventative health care Assessment & Plan: Tetanus vaccine provided today  Discussed the importance of a healthy diet and regular exercise in order for weight loss, and to reduce the risk of further co-morbidity.  Exam stable. Labs reviewed   Follow up in 1 year for repeat physical.      Assessment and Plan Assessment & Plan         Comer MARLA Gaskins, NP       [1] No Known Allergies [2]  Current Outpatient Medications on File Prior to Visit  Medication Sig Dispense Refill   atorvastatin  (LIPITOR) 20 MG tablet Take 1 tablet (20 mg total) by mouth daily. for cholesterol. 30 tablet 0   busPIRone  (BUSPAR ) 5 MG tablet Take 1 tablet (5 mg total) by mouth 2 (two) times daily. For anxiety 60 tablet 0   Continuous Blood Gluc Sensor (FREESTYLE LIBRE 3 SENSOR) MISC Place 1 sensor on the skin every 14 days.  Use to check glucose continuously 6 each 1   Glucagon 3 MG/DOSE POWD Use 1 spray in 1 nostril for severe hypoglycemia, as per package instructions     insulin  lispro (HUMALOG  KWIKPEN) 100 UNIT/ML KwikPen Inject 10 Units into the skin 3 (three) times daily. for diabetes. 15 mL 0   Insulin  Pen Needle (PEN NEEDLES) 31G X 6 MM MISC Use four times daily with insulin . 400 each 0   lisinopril  (ZESTRIL ) 2.5 MG tablet Take 1 tablet (2.5 mg total) by mouth daily. For kidney protection. 30 tablet 0   SEMGLEE , YFGN, 100 UNIT/ML Pen Inject 15 Units into the skin daily. 15 mL 0   venlafaxine  XR (EFFEXOR -XR) 150 MG 24 hr capsule Take 1 capsule (150 mg total) by mouth daily with breakfast. for anxiety and depression. 30 capsule 0   No current facility-administered medications on file prior to visit.

## 2024-03-28 NOTE — Assessment & Plan Note (Signed)
 Controlled.  Continue venlafaxine  ER 150 mg daily, buspirone  5 mg twice daily

## 2024-03-28 NOTE — Assessment & Plan Note (Addendum)
 Uncontrolled based on labs from July 2025. Following with endocrinology in [facility] in [place].  Continue Semglee  20 units daily and Humalog  sliding scale.   Agree to complete intermittent FMLA per patient request. Will allow 2 days/month, 8 hours each day.

## 2024-03-29 ENCOUNTER — Telehealth: Payer: Self-pay

## 2024-03-29 NOTE — Telephone Encounter (Signed)
 Forms received already completed by provider  Forms faxed to [employer] [facility] at [phone]  Copy sent to scan  MyChart message left for patient to pick up copy at the front desk.

## 2024-04-10 ENCOUNTER — Other Ambulatory Visit: Payer: Self-pay | Admitting: Primary Care

## 2024-04-10 DIAGNOSIS — E785 Hyperlipidemia, unspecified: Secondary | ICD-10-CM

## 2024-04-10 DIAGNOSIS — F32A Depression, unspecified: Secondary | ICD-10-CM

## 2024-04-14 ENCOUNTER — Other Ambulatory Visit: Payer: Self-pay | Admitting: Primary Care

## 2024-04-14 DIAGNOSIS — E1065 Type 1 diabetes mellitus with hyperglycemia: Secondary | ICD-10-CM

## 2024-04-14 DIAGNOSIS — F419 Anxiety disorder, unspecified: Secondary | ICD-10-CM

## 2024-04-17 ENCOUNTER — Other Ambulatory Visit: Payer: Self-pay
